# Patient Record
Sex: Male | Born: 1935 | Race: White | Hispanic: No | Marital: Married | Smoking: Former smoker
Health system: Southern US, Community
[De-identification: ages and names within clinical notes are randomized; demographics above are authoritative.]

## PROBLEM LIST (undated history)

## (undated) DIAGNOSIS — J189 Pneumonia, unspecified organism: Secondary | ICD-10-CM

## (undated) DIAGNOSIS — R609 Edema, unspecified: Secondary | ICD-10-CM

## (undated) DIAGNOSIS — IMO0002 Reserved for concepts with insufficient information to code with codable children: Secondary | ICD-10-CM

## (undated) DIAGNOSIS — I8390 Asymptomatic varicose veins of unspecified lower extremity: Secondary | ICD-10-CM

## (undated) DIAGNOSIS — I10 Essential (primary) hypertension: Secondary | ICD-10-CM

## (undated) DIAGNOSIS — I351 Nonrheumatic aortic (valve) insufficiency: Secondary | ICD-10-CM

## (undated) DIAGNOSIS — I350 Nonrheumatic aortic (valve) stenosis: Secondary | ICD-10-CM

## (undated) DIAGNOSIS — I6529 Occlusion and stenosis of unspecified carotid artery: Secondary | ICD-10-CM

## (undated) DIAGNOSIS — I77810 Thoracic aortic ectasia: Secondary | ICD-10-CM

## (undated) DIAGNOSIS — G459 Transient cerebral ischemic attack, unspecified: Secondary | ICD-10-CM

## (undated) DIAGNOSIS — R55 Syncope and collapse: Secondary | ICD-10-CM

## (undated) DIAGNOSIS — I495 Sick sinus syndrome: Secondary | ICD-10-CM

## (undated) DIAGNOSIS — R9430 Abnormal result of cardiovascular function study, unspecified: Secondary | ICD-10-CM

## (undated) HISTORY — DX: Sick sinus syndrome: I49.5

## (undated) HISTORY — DX: Asymptomatic varicose veins of unspecified lower extremity: I83.90

## (undated) HISTORY — DX: Thoracic aortic ectasia: I77.810

## (undated) HISTORY — DX: Nonrheumatic aortic (valve) stenosis: I35.0

## (undated) HISTORY — DX: Transient cerebral ischemic attack, unspecified: G45.9

## (undated) HISTORY — DX: Abnormal result of cardiovascular function study, unspecified: R94.30

## (undated) HISTORY — DX: Reserved for concepts with insufficient information to code with codable children: IMO0002

## (undated) HISTORY — DX: Pneumonia, unspecified organism: J18.9

## (undated) HISTORY — DX: Essential (primary) hypertension: I10

## (undated) HISTORY — DX: Syncope and collapse: R55

## (undated) HISTORY — DX: Edema, unspecified: R60.9

## (undated) HISTORY — DX: Nonrheumatic aortic (valve) insufficiency: I35.1

## (undated) HISTORY — DX: Occlusion and stenosis of unspecified carotid artery: I65.29

---

## 2004-07-25 ENCOUNTER — Ambulatory Visit: Payer: Self-pay | Admitting: Cardiology

## 2004-07-31 ENCOUNTER — Ambulatory Visit: Payer: Self-pay | Admitting: Cardiology

## 2004-07-31 ENCOUNTER — Ambulatory Visit (HOSPITAL_COMMUNITY): Admission: RE | Admit: 2004-07-31 | Discharge: 2004-07-31 | Payer: Self-pay | Admitting: Cardiology

## 2004-07-31 ENCOUNTER — Ambulatory Visit: Payer: Self-pay

## 2004-08-06 ENCOUNTER — Ambulatory Visit: Payer: Self-pay | Admitting: Cardiology

## 2004-11-08 ENCOUNTER — Ambulatory Visit: Payer: Self-pay | Admitting: Cardiology

## 2005-02-25 ENCOUNTER — Encounter: Admission: RE | Admit: 2005-02-25 | Discharge: 2005-02-25 | Payer: Self-pay | Admitting: *Deleted

## 2005-07-03 ENCOUNTER — Ambulatory Visit: Payer: Self-pay | Admitting: Cardiology

## 2006-02-27 ENCOUNTER — Encounter: Admission: RE | Admit: 2006-02-27 | Discharge: 2006-02-27 | Payer: Self-pay | Admitting: *Deleted

## 2006-06-11 ENCOUNTER — Ambulatory Visit: Payer: Self-pay | Admitting: Cardiology

## 2006-09-18 ENCOUNTER — Encounter: Admission: RE | Admit: 2006-09-18 | Discharge: 2006-09-18 | Payer: Self-pay | Admitting: *Deleted

## 2006-09-18 ENCOUNTER — Ambulatory Visit: Payer: Self-pay | Admitting: *Deleted

## 2007-03-05 ENCOUNTER — Ambulatory Visit: Payer: Self-pay | Admitting: *Deleted

## 2007-03-05 ENCOUNTER — Encounter: Admission: RE | Admit: 2007-03-05 | Discharge: 2007-03-05 | Payer: Self-pay | Admitting: *Deleted

## 2007-05-13 ENCOUNTER — Encounter: Payer: Self-pay | Admitting: Cardiology

## 2007-05-13 ENCOUNTER — Ambulatory Visit: Payer: Self-pay | Admitting: Cardiology

## 2007-05-13 ENCOUNTER — Ambulatory Visit: Payer: Self-pay

## 2007-05-22 ENCOUNTER — Ambulatory Visit: Payer: Self-pay | Admitting: Cardiology

## 2007-07-13 ENCOUNTER — Ambulatory Visit: Payer: Self-pay | Admitting: Cardiology

## 2008-01-27 ENCOUNTER — Ambulatory Visit: Payer: Self-pay | Admitting: Cardiology

## 2008-03-03 ENCOUNTER — Ambulatory Visit: Payer: Self-pay | Admitting: *Deleted

## 2008-07-05 ENCOUNTER — Ambulatory Visit: Payer: Self-pay | Admitting: Cardiology

## 2008-07-19 ENCOUNTER — Encounter: Payer: Self-pay | Admitting: Cardiology

## 2008-07-19 ENCOUNTER — Ambulatory Visit: Payer: Self-pay

## 2008-08-22 ENCOUNTER — Encounter: Payer: Self-pay | Admitting: Cardiology

## 2008-08-22 DIAGNOSIS — I10 Essential (primary) hypertension: Secondary | ICD-10-CM

## 2008-08-22 DIAGNOSIS — I714 Abdominal aortic aneurysm, without rupture, unspecified: Secondary | ICD-10-CM | POA: Insufficient documentation

## 2008-08-22 DIAGNOSIS — I495 Sick sinus syndrome: Secondary | ICD-10-CM

## 2008-08-22 HISTORY — DX: Sick sinus syndrome: I49.5

## 2008-08-22 HISTORY — DX: Essential (primary) hypertension: I10

## 2008-08-23 ENCOUNTER — Ambulatory Visit: Payer: Self-pay | Admitting: Cardiology

## 2009-03-24 ENCOUNTER — Encounter: Admission: RE | Admit: 2009-03-24 | Discharge: 2009-03-24 | Payer: Self-pay | Admitting: Vascular Surgery

## 2009-03-24 ENCOUNTER — Ambulatory Visit: Payer: Self-pay | Admitting: Vascular Surgery

## 2009-05-08 ENCOUNTER — Encounter: Payer: Self-pay | Admitting: Cardiology

## 2009-05-25 ENCOUNTER — Encounter: Admission: RE | Admit: 2009-05-25 | Discharge: 2009-05-25 | Payer: Self-pay | Admitting: Family Medicine

## 2009-07-20 ENCOUNTER — Ambulatory Visit: Payer: Self-pay | Admitting: Family Medicine

## 2009-07-23 ENCOUNTER — Encounter (INDEPENDENT_AMBULATORY_CARE_PROVIDER_SITE_OTHER): Payer: Self-pay | Admitting: Emergency Medicine

## 2009-07-23 ENCOUNTER — Emergency Department (HOSPITAL_COMMUNITY): Admission: EM | Admit: 2009-07-23 | Discharge: 2009-07-23 | Payer: Self-pay | Admitting: Emergency Medicine

## 2009-07-23 ENCOUNTER — Ambulatory Visit: Payer: Self-pay | Admitting: Vascular Surgery

## 2009-08-16 ENCOUNTER — Ambulatory Visit: Payer: Self-pay | Admitting: Family Medicine

## 2009-08-16 ENCOUNTER — Encounter: Payer: Self-pay | Admitting: Cardiology

## 2009-08-17 ENCOUNTER — Encounter: Payer: Self-pay | Admitting: Cardiology

## 2009-08-18 ENCOUNTER — Encounter: Payer: Self-pay | Admitting: Cardiology

## 2009-08-20 ENCOUNTER — Encounter: Payer: Self-pay | Admitting: Cardiology

## 2009-08-20 DIAGNOSIS — I6529 Occlusion and stenosis of unspecified carotid artery: Secondary | ICD-10-CM

## 2009-08-20 HISTORY — DX: Occlusion and stenosis of unspecified carotid artery: I65.29

## 2009-08-21 ENCOUNTER — Ambulatory Visit: Payer: Self-pay

## 2009-08-21 ENCOUNTER — Ambulatory Visit: Payer: Self-pay | Admitting: Cardiology

## 2009-08-21 DIAGNOSIS — R609 Edema, unspecified: Secondary | ICD-10-CM | POA: Insufficient documentation

## 2009-08-21 HISTORY — DX: Edema, unspecified: R60.9

## 2009-08-22 ENCOUNTER — Ambulatory Visit (HOSPITAL_COMMUNITY): Admission: RE | Admit: 2009-08-22 | Discharge: 2009-08-22 | Payer: Self-pay | Admitting: Cardiology

## 2009-08-22 ENCOUNTER — Ambulatory Visit: Payer: Self-pay

## 2009-08-22 ENCOUNTER — Ambulatory Visit: Payer: Self-pay | Admitting: Cardiology

## 2009-08-22 ENCOUNTER — Encounter: Payer: Self-pay | Admitting: Cardiology

## 2009-08-24 ENCOUNTER — Ambulatory Visit: Payer: Self-pay | Admitting: Cardiology

## 2009-09-12 ENCOUNTER — Encounter: Payer: Self-pay | Admitting: Cardiology

## 2009-10-27 ENCOUNTER — Ambulatory Visit: Payer: Self-pay | Admitting: Cardiology

## 2010-04-18 ENCOUNTER — Ambulatory Visit: Payer: Self-pay | Admitting: Cardiology

## 2010-06-18 ENCOUNTER — Encounter: Payer: Self-pay | Admitting: Cardiology

## 2010-06-19 ENCOUNTER — Encounter: Payer: Self-pay | Admitting: Cardiovascular Disease

## 2010-06-20 ENCOUNTER — Encounter: Payer: Self-pay | Admitting: Cardiology

## 2010-06-21 ENCOUNTER — Encounter: Payer: Self-pay | Admitting: Cardiovascular Disease

## 2010-06-21 ENCOUNTER — Encounter: Payer: Self-pay | Admitting: Cardiology

## 2010-06-21 ENCOUNTER — Ambulatory Visit: Admission: RE | Admit: 2010-06-21 | Discharge: 2010-06-21 | Payer: Self-pay | Source: Home / Self Care

## 2010-06-22 ENCOUNTER — Encounter: Payer: Self-pay | Admitting: Cardiology

## 2010-06-26 ENCOUNTER — Encounter: Payer: Self-pay | Admitting: Cardiology

## 2010-07-03 NOTE — Assessment & Plan Note (Signed)
Summary: yearly/sl  Medications Added FUROSEMIDE 40 MG TABS (FUROSEMIDE) Take one tablet by mouth daily. HYDROCHLOROTHIAZIDE 25 MG TABS (HYDROCHLOROTHIAZIDE) Take one tablet by mouth daily. BYSTOLIC 5 MG TABS (NEBIVOLOL HCL) once daily      Allergies Added: NKDA  Visit Type:  Follow-up Referring Provider:  Julieanne Manson M.D. Primary Provider:  Julieanne Manson M.D.  CC:  edema.  History of Present Illness: The patient is seen to followup history of aortic valve disease.  He also had a significant pulmonary illness approximately 2 months ago.  It seems that he had a severe coughing spell and then significant hemoptysis while at home.  It is my understanding that he was thought to have had pneumonia.  He was treated as an outpatient.  He now has persistent edema.  This has partially responded to diuretics but he still has significant edema.  It is not yet clear if he had some of the edema before the pulmonary illness.  He has not had any significant chest pain.  He has no more hemoptysis.  He does feel weak.  He is not having chest pain.  Current Medications (verified): 1)  Aspirin 81 Mg  Tabs (Aspirin) .... Once Daily 2)  Quinapril Hcl 40 Mg Tabs (Quinapril Hcl) .Marland Kitchen.. 1 Once Daily 3)  Amlodipine Besylate 5 Mg Tabs (Amlodipine Besylate) .... Take One Tablet By Mouth Daily 4)  Metoprolol Tartrate 25 Mg Tabs (Metoprolol Tartrate) .... Take 1/2 Tablet By Mouth Twice A Day 5)  Vitamin D .... Once Daily 6)  Furosemide 40 Mg Tabs (Furosemide) .... Take One Tablet By Mouth Daily. 7)  Hydrochlorothiazide 25 Mg Tabs (Hydrochlorothiazide) .... Take One Tablet By Mouth Daily. 8)  Bystolic 5 Mg Tabs (Nebivolol Hcl) .... 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 starting in January or February of 2011 Hemoptysis... significant outpatient episode... February, 2011..... related to pneumonia by report pneumonia... right middle lobe.... July 23, 2009  Review of Systems       Patient denies fever, chills, headache, sweats, rash, change in vision, change in hearing, chest pain, nausea or vomiting, urinary symptoms.  All other systems are reviewed and are negative.  Vital Signs:  Patient profile:   77 year old male Height:      70 inches Weight:      132 pounds BMI:     19.01 Pulse rate:   55 / minute BP sitting:   108 / 52  (left arm) Cuff size:   regular  Vitals Entered By: Hardin Negus, RMA (August 21, 2009 11:11 AM)  Physical Exam  General:  patient is stable . Head:  head is atraumatic.  He has a skin lesion on his Eyes:  no xanthelasma. Neck:  no jugular venous distention. Chest Wall:  no chest wall tenderness. Lungs:  lungs reveal some decreased breath sounds in his right lung. Heart:  cardiac exam reveals an alert S2.  There is a murmur of aortic insufficiency. Abdomen:  abdomen is soft. Msk:  patient has kyphosis. Extremities:  there is 2+ peripheral edema bilaterally in the lower extremities. Skin:  patient has a skin lesion on the top of his head. Psych:  patient is oriented to person time and place.  Affect is normal.   Impression & Recommendations:  Problem #  1:  * HEMOPTYSIS / PNEUMONIA We're waiting for a followup chest x-ray done recently.  It appears that he had pneumonia by history.  We need to be sure that there is not a primary cardiac etiology for his hemoptysis.  This seems unlikely as he improved with antibiotics.  Problem # 2:  EDEMA (ICD-782.3) The etiology of his edema is not clear.  We need to rule out heart failure.  His recent labs also need to be reviewed to see if he has marked decreased  albumin.  Also needs no venous abnormal thyroid functions.  Patient will have a 2-D echo to reassess his LV function and his aortic insufficiency.  This will be scheduled see him back for early followup.  Problem # 3:  AORTIC INSUFFICIENCY (ICD-424.1)  His updated medication list for this problem includes:    Quinapril Hcl 40 Mg Tabs (Quinapril hcl) .Marland Kitchen... 1 once daily    Metoprolol Tartrate 25 Mg Tabs (Metoprolol tartrate) .Marland Kitchen... Take 1/2 tablet by mouth twice a day    Furosemide 40 Mg Tabs (Furosemide) .Marland Kitchen... Take one tablet by mouth daily.    Hydrochlorothiazide 25 Mg Tabs (Hydrochlorothiazide) .Marland Kitchen... Take one tablet by mouth daily.    Bystolic 5 Mg Tabs (Nebivolol hcl) ..... Once daily catheterization left followup 2-D echo to reassess his aortic insufficiency.  Orders: Echocardiogram (Echo)  Problem # 4:  CAROTID ARTERY DISEASE (ICD-433.10)  His updated medication list for this problem includes:    Aspirin 81 Mg Tabs (Aspirin) ..... Once daily A later date we will look at followup carotid Dopplers.  Problem # 5:  ESSENTIAL HYPERTENSION, BENIGN (ICD-401.1)  His updated medication list for this problem includes:    Aspirin 81 Mg Tabs (Aspirin) ..... Once daily    Quinapril Hcl 40 Mg Tabs (Quinapril hcl) .Marland Kitchen... 1 once daily    Amlodipine Besylate 5 Mg Tabs (Amlodipine besylate) .Marland Kitchen... Take one tablet by mouth daily    Metoprolol Tartrate 25 Mg Tabs (Metoprolol tartrate) .Marland Kitchen... Take 1/2 tablet by mouth twice a day    Furosemide 40 Mg Tabs (Furosemide) .Marland Kitchen... Take one tablet by mouth daily.    Hydrochlorothiazide 25 Mg Tabs (Hydrochlorothiazide) .Marland Kitchen... Take one tablet by mouth daily.    Bystolic 5 Mg Tabs (Nebivolol hcl) ..... Once daily Blood pressure is controlled today.  No change in therapy.  Patient Instructions: 1)  Your physician has requested that you have an echocardiogram.  Echocardiography is a painless test that uses sound waves to create images of your heart. It provides your  doctor with information about the size and shape of your heart and how well your heart's chambers and valves are working.  This procedure takes approximately one hour. There are no restrictions for this procedure. 2)  Follow up with Dr Myrtis Ser on [facility] 3/24

## 2010-07-03 NOTE — Miscellaneous (Signed)
Summary: Orders Update  Clinical Lists Changes 

## 2010-07-03 NOTE — Assessment & Plan Note (Signed)
Summary: 9 weeks f/u sl  Medications Added VITAMIN D3 1000 UNIT CAPS (CHOLECALCIFEROL) Take 1 tablet by mouth once a day ALPRAZOLAM 0.5 MG TABS (ALPRAZOLAM) as needed      Allergies Added: NKDA  Visit Type:  9 weeks follow up Referring Provider:  Julieanne Manson M.D. Primary Provider:  Julieanne Manson M.D.  CC:  bradycardia.  History of Present Illness: The patient is seen for followup of blood pressure treatment, sinus bradycardia, episode of hemoptysis.  Is doing very well.  He has not had any return of his cough or hemoptysis.  He believed that this was related to an outpatient pneumonia.  Several days ago he bruised his left ankle area.  This is healing appropriately.  He did not have syncope.  Current Medications (verified): 1)  Aspirin 81 Mg  Tabs (Aspirin) .... Once Daily 2)  Quinapril Hcl 40 Mg Tabs (Quinapril Hcl) .Marland Kitchen.. 1 Once Daily 3)  Amlodipine Besylate 5 Mg Tabs (Amlodipine Besylate) .... Take One Tablet By Mouth Daily 4)  Vitamin D3 1000 Unit Caps (Cholecalciferol) .... Take 1 Tablet By Mouth Once A Day 5)  Furosemide 40 Mg Tabs (Furosemide) .Marland Kitchen.. 1 By Mouth M-W-F 6)  Hydrochlorothiazide 25 Mg Tabs (Hydrochlorothiazide) .... Take One Tablet By Mouth Daily. 7)  Bystolic 5 Mg Tabs (Nebivolol Hcl) .... Once Daily 8)  Alprazolam 0.5 Mg Tabs (Alprazolam) .... 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 starting in January or February of 2011.Marland KitchenMarland Kitchenprobably mild volume overload and venous insufficiency Hemoptysis... significant outpatient episode... February, 2011..... related to pneumonia by report pneumonia... right middle lobe....  July 23, 2009.. /  chest x-ray August 16, 2009... almost complete clearing  Review of Systems       Patient denies fever, chills, headache, sweats, rash, change in vision, change in hearing, chest pain, cough, nausea vomiting, urinary symptoms.  All other systems are reviewed and are negative.  Vital Signs:  Patient profile:   77 year old male Height:      70 inches Weight:      129 pounds BMI:     18.58 Pulse rate:   42 / minute Pulse rhythm:   regular Resp:     18 per minute BP sitting:   130 / 60  (left arm) Cuff size:   regular  Vitals Entered By: Vikki Ports (Oct 27, 2009 8:32 AM)  Physical Exam  General:  he looks quite good today. Eyes:  no xanthelasma. Neck:  no jugular venous tension. Lungs:  lungs are clear.  Respiratory effort is not labored. Heart:  cardiac exam reveals S1-S2.  There is a 2/6 murmur of aortic insufficiency. Abdomen:  abdomen is soft. Extremities:  he is wearing support hose.  No peripheral edema. Psych:  patient is oriented to person time and place.  Affect is normal.   Impression & Recommendations:  Problem # 1:  * HEMOPTYSIS / PNEUMONIA This tissue is completely resolved.  Problem # 2:  EDEMA (ICD-782.3) This issue is stable.  No change in therapy.  Recent labs done in April show creatinine of 1.04.  Potassium 4.6.  Problem # 3:  AORTIC INSUFFICIENCY (ICD-424.1) aortic insufficiency is mild/moderate.  As we follow.  Problem # 4:  SINUS BRADYCARDIA (ICD-427.81) EKG is done today and reviewed by me.  He  has sinus bradycardia.  This is an old problem.  He does not have symptoms.  No change in his meds at this time.  At some point we may have to completely stop any beta-blockade.followup in 6 months.  Other Orders: EKG w/ Interpretation (93000)  Patient Instructions: 1)  Your physician wants you to follow-up in:   6 months.  You will receive a reminder letter in the mail two months in advance. If you don't receive a letter, please call  our office to schedule the follow-up appointment.

## 2010-07-03 NOTE — Miscellaneous (Signed)
Summary: Orders Update  Clinical Lists Changes  Orders: Added new Test order of Carotid Duplex (Carotid Duplex) - Signed 

## 2010-07-03 NOTE — Assessment & Plan Note (Signed)
Summary: PER CHECK OUT/SF  Medications Added FUROSEMIDE 40 MG TABS (FUROSEMIDE) 1 by mouth m-w-f      Allergies Added: NKDA  Visit Type:  Follow-up Primary Provider:  Julieanne Manson M.D.  CC:  edema.  History of Present Illness: The patient is seen in followup evaluation of his edema. see my complete note of August 21, 2009.  We have received information from Dr.Gilbert.  Chest x-ray was done August 16, 2009.  It was compared with the film of July 20, 2009.  There was near complete clearing of the previously noted dense infiltrate in the right middle lobe.  There was no CHF.  The chest is hyperexpanded.  There were old vertebral compression deformities at T8.  There were old rib fractures of the seventh, eighth, ninth, and 10th ribs on the right. I also received blood results from August 18, 2009.  Hemoglobin is 12.5.  BUN is 18 and creatinine 0.98.  Potassium was 4.6.  Albumin was in the normal range.  TSH was normal also. An echo was ordered and I have carefully reviewed this study personally.  Ejection fraction is 60%.  There is mild aortic stenosis and moderate aortic regurgitation.  There is no significant change from the past.  Patient is a feeling better.  His edema is improving but still present.  It does decrease at nighttime.  He has a skin lesion on the anterior aspect of his right shin.  This has been present for a prolonged period of time.  Current Medications (verified): 1)  Aspirin 81 Mg  Tabs (Aspirin) .... Once Daily 2)  Quinapril Hcl 40 Mg Tabs (Quinapril Hcl) .Marland Kitchen.. 1 Once Daily 3)  Amlodipine Besylate 5 Mg Tabs (Amlodipine Besylate) .... Take One Tablet By Mouth Daily 4)  Metoprolol Tartrate 25 Mg Tabs (Metoprolol Tartrate) .... Take 1/2 Tablet By Mouth Twice A Day 5)  Vitamin D .... Once Daily 6)  Furosemide 40 Mg Tabs (Furosemide) .Marland Kitchen.. 1 By Mouth M-W-F 7)  Hydrochlorothiazide 25 Mg Tabs (Hydrochlorothiazide) .... Take One Tablet By Mouth Daily. 8)  Bystolic 5 Mg Tabs  (Nebivolol Hcl) .... 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 starting in January or February of 2011.Marland KitchenMarland Kitchenprobably mild volume overload and venous insufficiency Hemoptysis... significant outpatient episode... February, 2011..... related to pneumonia by report pneumonia... right middle lobe.... July 23, 2009.. /  chest x-ray August 16, 2009... almost complete clearing  Review of Systems       Patient denies fever, chills, headache, sweats, rash, change in vision, change in hearing, chest pain, cough, shortness of breath, nausea or vomiting, urinary symptoms.  All other systems are reviewed and are negative.  Vital Signs:  Patient profile:   77 year old male Height:      70 inches Weight:      131 pounds BMI:     18.86 Pulse rate:   48 / minute Resp:     16 per minute BP sitting:   119 / 54  (right arm)  Vitals Entered By: Marrion Coy, CNA (August 24, 2009 10:45 AM)  Physical Exam  General:  patient is stable in general. Head:  head is atraumatic. Eyes:  no xanthelasma. Neck:  no jugular venous distention.  Radiated murmur to the neck. Chest Wall:  no chest  wall tenderness. Lungs:  lungs are clear.  Respiratory effort is nonlabored. Heart:  cardiac exam reveals S1-S2.  There is a systolic murmur and a murmur of aortic insufficiency. Abdomen:  abdomen is soft. Msk:  patient has kyphosis of the spine. Extremities:  there is 1+ peripheral edema bilaterally.  It is slightly worse in the right leg.  There is a bandage on the anterior aspect of the right shin. Skin:  her no skin rashes. Psych:  patient is oriented to person time and place.   Affect is normal.   Impression & Recommendations:  Problem # 1:  * HEMOPTYSIS / PNEUMONIA The patient's pneumonia appears to be resolved.  Chest x-ray has been read as almost complete clearing.  We will look into a followup film several months.  It appears that the hemoptysis was most likely related completely to his pneumonia.  Problem # 2:  EDEMA (ICD-782.3) It seems that they're several components to his edema.  There is mild volume overload that is treated with diuretics and should be continued.  There is a component of venous insufficiency also.  I've encouraged him to go ahead and wear his support hose.  He understands to put them on the morning.  I do feel that this is appropriate on both legs including putting the support hose over the bandage on his right leg.  This may help with healing.  If he has further difficulties with this skin lesion on his right leg he'll need further evaluation  Problem # 3:  AORTIC INSUFFICIENCY (ICD-424.1)  His updated medication list for this problem includes:    Quinapril Hcl 40 Mg Tabs (Quinapril hcl) .Marland Kitchen... 1 once daily    Metoprolol Tartrate 25 Mg Tabs (Metoprolol tartrate) .Marland Kitchen... Take 1/2 tablet by mouth twice a day    Furosemide 40 Mg Tabs (Furosemide) .Marland Kitchen... 1 by mouth m-w-f    Hydrochlorothiazide 25 Mg Tabs (Hydrochlorothiazide) .Marland Kitchen... Take one tablet by mouth daily.    Bystolic 5 Mg Tabs (Nebivolol hcl) ..... Once daily The echo shows that the aortic insufficiency remains moderate.  There is very mild aortic stenosis.  No further workup at this time.  Problem # 4:  ESSENTIAL HYPERTENSION, BENIGN (ICD-401.1)  His updated medication list for this problem includes:    Aspirin 81 Mg Tabs (Aspirin) ..... Once daily    Quinapril Hcl 40 Mg Tabs (Quinapril hcl) .Marland Kitchen... 1 once daily    Amlodipine Besylate 5 Mg Tabs (Amlodipine besylate) .Marland Kitchen... Take one tablet by mouth daily    Metoprolol Tartrate 25 Mg Tabs (Metoprolol tartrate) .Marland Kitchen... Take 1/2 tablet by  mouth twice a day    Furosemide 40 Mg Tabs (Furosemide) .Marland Kitchen... 1 by mouth m-w-f    Hydrochlorothiazide 25 Mg Tabs (Hydrochlorothiazide) .Marland Kitchen... Take one tablet by mouth daily.    Bystolic 5 Mg Tabs (Nebivolol hcl) ..... Once daily Blood pressure is under good control.  No change in therapy.  I will see him back in approximately 8 weeks to recheck his overall status and review all of his problems again.  Patient Instructions: 1)  Follow up in 9 weeks

## 2010-07-03 NOTE — Assessment & Plan Note (Signed)
Summary: Benjamin Mueller   Visit Type:  6 months follow up Referring Provider:  Julieanne Manson M.D. Primary Provider:  Julieanne Manson M.D.  CC:  hypertension.  History of Present Illness: The patient is seen for followup of hypertension.  He also has mild aortic stenosis and mild aortic insufficiency.  I have reviewed his records back to 2009.  He had ultrasound of the abdomen and his aorta was 2.4 cm.  There was some ulcerated plaque in the posterior aspect of the abdominal aorta.  He was referred to vascular surgery.  There was no deep penetration and it was felt that this could be followed.  He has not had a followup ultrasound since then.   Is not having any chest pain.  There's been no shortness of breath.  Current Medications (verified): 1)  Aspirin 81 Mg  Tabs (Aspirin) .... Once Daily 2)  Quinapril Hcl 40 Mg Tabs (Quinapril Hcl) .Marland Kitchen.. 1 Once Daily 3)  Amlodipine Besylate 5 Mg Tabs (Amlodipine Besylate) .... Take One Tablet By Mouth Daily 4)  Vitamin D3 1000 Unit Caps (Cholecalciferol) .... Take 1 Tablet By Mouth Once A Day 5)  Furosemide 40 Mg Tabs (Furosemide) .Marland Kitchen.. 1 By Mouth M-W-F 6)  Hydrochlorothiazide 25 Mg Tabs (Hydrochlorothiazide) .... Take One Tablet By Mouth Daily. 7)  Bystolic 5 Mg Tabs (Nebivolol Hcl) .... Once Daily 8)  Alprazolam 0.5 Mg Tabs (Alprazolam) .... As Needed  Allergies (verified): No Known Drug Allergies  Past History:  Past Medical History: Carotid artery diseaase...minimal...doppler.Marland KitchenMarland Kitchen2/2010 EF  60%....echo   07/2008 AS...mild....echo.Marland Kitchen.07/2008 AI...moderate...echo.Marland Kitchen.07/2008 * ULCERATED PLAQUE IN AORTA  . 2009. abdominal aorta... vascular surgery felt this could be followed.Marland Kitchen aorta 2.4 cm at that time Thoracic aorta   mild dilatation ESSENTIAL HYPERTENSION, BENIGN (ICD-401.1) SINUS BRADYCARDIA (ICD-427.81) Edema... starting in January or February of 2011.Marland KitchenMarland Kitchenprobably mild volume overload and venous insufficiency Hemoptysis... significant outpatient  episode... February, 2011..... related to pneumonia by report pneumonia... right middle lobe.... July 23, 2009.. /  chest x-ray August 16, 2009... almost complete clearing  Review of Systems       Patient denies fever, chills, headache, sweats, rash, change in vision, change in hearing, nausea vomiting, urinary symptoms.  All of the systems are reviewed and are negative  Vital Signs:  Patient profile:   77 year old male Height:      70 inches Weight:      128.06 pounds BMI:     18.44 Pulse rate:   62 / minute BP sitting:   128 / 52  (left arm) Cuff size:   regular  Vitals Entered By: Caralee Ates CMA (April 18, 2010 9:28 AM)  Physical Exam  General:  patient is stable today. Head:  head is atraumatic.  He has some skin lesions on his head. Eyes:  no xanthelasma. Neck:  no jugular venous distention. Chest Wall:  no chest wall tenderness. Lungs:  lungs are clear.  Respiratory effort is nonlabored. Heart:  cardiac exam reveals S1-S2.  No clicks or significant murmurs. Abdomen:  abdomen is soft. Msk:  no musculoskeletal deformities. Extremities:  no peripheral edema. Skin:  no skin rashes. Psych:  patient is oriented to person time and place affect is normal.   Impression & Recommendations:  Problem # 1:  * THORACIC AORTA... MILD DILATATION This will be followed carefully over time by echo.  Problem # 2:  * HEMOPTYSIS / PNEUMONIA This problem has completely resolved.  There's been no recurrence.  Problem # 3:  EDEMA (ICD-782.3) Is no edema.  He is stable.  Problem # 4:  AORTIC STENOSIS (ICD-424.1)  His updated medication list for this problem includes:    Quinapril Hcl 40 Mg Tabs (Quinapril hcl) .Marland Kitchen... 1 once daily    Furosemide 40 Mg Tabs (Furosemide) .Marland Kitchen... 1 by mouth m-w-f    Hydrochlorothiazide 25 Mg Tabs (Hydrochlorothiazide) .Marland Kitchen... Take one tablet by mouth daily.    Bystolic 5 Mg Tabs (Nebivolol hcl) ..... Once daily His aortic valve disease can be followed by  echo over time.  No studies are needed at this time.  Problem # 5:  * ULCERATED PLAQUE IN AORTA I talked to the patient about follow up ultrasound.  He is and at this time.  I feel there is no urgency.  We will rediscuss it in 6 months.  Problem # 6:  ESSENTIAL HYPERTENSION, BENIGN (ICD-401.1)  His updated medication list for this problem includes:    Aspirin 81 Mg Tabs (Aspirin) ..... Once daily    Quinapril Hcl 40 Mg Tabs (Quinapril hcl) .Marland Kitchen... 1 once daily    Amlodipine Besylate 5 Mg Tabs (Amlodipine besylate) .Marland Kitchen... Take one tablet by mouth daily    Furosemide 40 Mg Tabs (Furosemide) .Marland Kitchen... 1 by mouth m-w-f    Hydrochlorothiazide 25 Mg Tabs (Hydrochlorothiazide) .Marland Kitchen... Take one tablet by mouth daily.    Bystolic 5 Mg Tabs (Nebivolol hcl) ..... Once daily Blood pressure is well-controlled.  No change in therapy.  Problem # 7:  SINUS BRADYCARDIA (ICD-427.81)  His updated medication list for this problem includes:    Aspirin 81 Mg Tabs (Aspirin) ..... Once daily    Quinapril Hcl 40 Mg Tabs (Quinapril hcl) .Marland Kitchen... 1 once daily    Amlodipine Besylate 5 Mg Tabs (Amlodipine besylate) .Marland Kitchen... Take one tablet by mouth daily    Bystolic 5 Mg Tabs (Nebivolol hcl) ..... Once daily Heart rate is stable.  No change in therapy.cardiology followup in 6 months.  Patient Instructions: 1)  Your physician wants you to follow-up in:  6 months.  You will receive a reminder letter in the mail two months in advance. If you don't receive a letter, please call our office to schedule the follow-up appointment.

## 2010-07-03 NOTE — Miscellaneous (Signed)
  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 (08/20/2009 20:43) Added new observation of PRIMARY MD: Julieanne Manson M.D. (08/20/2009 20:43)       Past History:  Past Medical History:  Carotid artery diseaase...minimal...doppler.Marland KitchenMarland Kitchen2/2010 EF  60%....echo   07/2008 AS...mild....echo.Marland Kitchen.07/2008 AI...moderate...echo.Marland Kitchen.07/2008 * ULCERATED PLAQUE IN AORTA ABDOMINAL AORTIC ANEURYSM (ICD-441.4) ESSENTIAL HYPERTENSION, BENIGN (ICD-401.1) SINUS BRADYCARDIA (ICD-427.81)

## 2010-07-05 NOTE — Miscellaneous (Signed)
  Clinical Lists Changes  Observations: Added new observation of PAST MED HX: Carotid artery diseaase...minimal...doppler.Marland KitchenMarland Kitchen2/2010 EF  60%....echo   07/2008  /  EF 60%... echo... March, 2011 AS...mild....echo.Marland Kitchen.07/2008  /  mild... echo.. March, 2011 AI...moderate...echo.Marland Kitchen.07/2008  /  moderate... echo... march 2011 * ULCERATED PLAQUE IN [place]ORTA  . 2009. abdominal aorta... vascular surgery felt this could be followed.Marland Kitchen aorta 2.4 cm at that time Thoracic aorta   mild dilatation ESSENTIAL HYPERTENSION, BENIGN (ICD-401.1) SINUS BRADYCARDIA (ICD-427.81) Edema... starting in January or February of 2011.Marland KitchenMarland Kitchenprobably mild volume overload and venous insufficiency Hemoptysis... significant outpatient episode... February, 2011..... related to pneumonia by report pneumonia... right middle lobe.... July 23, 2009.. /  chest x-ray August 16, 2009... almost complete clearing  (06/20/2010 9:36) Added new observation of REFERRING MD: Julieanne Manson M.D. (06/20/2010 9:36) Added new observation of PRIMARY MD: Julieanne Manson M.D. (06/20/2010 9:36)       Past History:  Past Medical History: Carotid artery diseaase...minimal...doppler.Marland KitchenMarland Kitchen2/2010 EF  60%....echo   07/2008  /  EF 60%... echo... March, 2011 AS...mild....echo.Marland Kitchen.07/2008  /  mild... echo.. March, 2011 AI...moderate...echo.Marland Kitchen.07/2008  /  moderate... echo... march 2011 * ULCERATED PLAQUE IN [place]  . 2009. abdominal aorta... vascular surgery felt this could be followed.Marland Kitchen aorta 2.4 cm at that time Thoracic aorta   mild dilatation ESSENTIAL HYPERTENSION, BENIGN (ICD-401.1) SINUS BRADYCARDIA (ICD-427.81) Edema... starting in January or February of 2011.Marland KitchenMarland Kitchenprobably mild volume overload and venous insufficiency Hemoptysis... significant outpatient episode... February, 2011..... related to pneumonia by report pneumonia... right middle lobe.... July 23, 2009.. /  chest x-ray August 16, 2009... almost complete clearing

## 2010-07-05 NOTE — Assessment & Plan Note (Signed)
Summary: [facility]   Referring Provider:  Julieanne Manson M.D. Primary Provider:  Julieanne Manson M.D.   History of Present Illness: I spoke with Dr. Sullivan Lone 06/20/2010. Pt. had back/ abdominal pain. I arranged ultrasound of aorta. It is unchanged. I feel pain is not cardiovascular. I called DR. Sullivan Lone back to let him know. I called pt.'s wife also. No further cardiac w/u  now.  Allergies: No Known Drug Allergies

## 2010-07-05 NOTE — Miscellaneous (Signed)
Summary: Orders Update  Clinical Lists Changes  Orders: Added new Test order of Abdominal Aorta Duplex (Abd Aorta Duplex) - Signed 

## 2010-07-05 NOTE — Miscellaneous (Signed)
  Clinical Lists Changes  Observations: Added new observation of PAST MED HX: Carotid artery diseaase...minimal...doppler.Marland KitchenMarland Kitchen2/2010 EF  60%....echo   07/2008  /  EF 60%... echo... March, 2011 AS...mild....echo.Marland Kitchen.07/2008  /  mild... echo.. March, 2011 AI...moderate...echo.Marland Kitchen.07/2008  /  moderate... echo... march 2011 * ULCERATED PLAQUE IN [place]  . 2009. abdominal aorta... vascular surgery felt this could be followed.Marland Kitchen aorta 2.4 cm at that time /  followed by Dr. Madilyn Fireman over time  /   duplex of the abdominal aorta, our office, June 21, 2010, normal caliber aorta... no dissection... irregular plaque in the mid and distal aorta with ulcerative appearance similar to prior study, severe stenosis of IMA Thoracic aorta   mild dilatation ESSENTIAL HYPERTENSION, BENIGN (ICD-401.1) SINUS BRADYCARDIA (ICD-427.81) Edema... starting in January or February of 2011.Marland KitchenMarland Kitchenprobably mild volume overload and venous insufficiency Hemoptysis... significant outpatient episode... February, 2011..... related to pneumonia by report pneumonia... right middle lobe.... July 23, 2009.. /  chest x-ray August 16, 2009... almost complete clearing  (06/26/2010 10:09) Added new observation of REFERRING MD: Julieanne Manson M.D. (06/26/2010 10:09) Added new observation of PRIMARY MD: Julieanne Manson M.D. (06/26/2010 10:09)       Past History:  Past Medical History: Carotid artery diseaase...minimal...doppler.Marland KitchenMarland Kitchen2/2010 EF  60%....echo   07/2008  /  EF 60%... echo... March, 2011 AS...mild....echo.Marland Kitchen.07/2008  /  mild... echo.. March, 2011 AI...moderate...echo.Marland Kitchen.07/2008  /  moderate... echo... march 2011 * ULCERATED PLAQUE IN [place]  . 2009. abdominal aorta... vascular surgery felt this could be followed.Marland Kitchen aorta 2.4 cm at that time /  followed by Dr. Madilyn Fireman over time  /   duplex of the abdominal aorta, our office, June 21, 2010, normal caliber aorta... no dissection... irregular plaque in the mid and distal aorta with ulcerative  appearance similar to prior study, severe stenosis of IMA Thoracic aorta   mild dilatation ESSENTIAL HYPERTENSION, BENIGN (ICD-401.1) SINUS BRADYCARDIA (ICD-427.81) Edema... starting in January or February of 2011.Marland KitchenMarland Kitchenprobably mild volume overload and venous insufficiency Hemoptysis... significant outpatient episode... February, 2011..... related to pneumonia by report pneumonia... right middle lobe.... July 23, 2009.. /  chest x-ray August 16, 2009... almost complete clearing

## 2010-07-31 NOTE — Letter (Signed)
Summary: [facility]   Imported By: Marylou Mccoy 07/26/2010 13:31:47  _____________________________________________________________________  External Attachment:    Type:   Image     Comment:   External Document

## 2010-08-24 LAB — CBC
MCHC: 34 g/dL (ref 30.0–36.0)
MCV: 100.1 fL — ABNORMAL HIGH (ref 78.0–100.0)
Platelets: 325 10*3/uL (ref 150–400)
RDW: 13.1 % (ref 11.5–15.5)

## 2010-08-24 LAB — URINALYSIS, ROUTINE W REFLEX MICROSCOPIC
Bilirubin Urine: NEGATIVE
Ketones, ur: NEGATIVE mg/dL
Nitrite: NEGATIVE
Protein, ur: NEGATIVE mg/dL
Specific Gravity, Urine: 1.012 (ref 1.005–1.030)
Urobilinogen, UA: 0.2 mg/dL (ref 0.0–1.0)

## 2010-08-24 LAB — POCT I-STAT, CHEM 8
BUN: 15 mg/dL (ref 6–23)
Calcium, Ion: 1.22 mmol/L (ref 1.12–1.32)
HCT: 35 % — ABNORMAL LOW (ref 39.0–52.0)
Hemoglobin: 11.9 g/dL — ABNORMAL LOW (ref 13.0–17.0)
TCO2: 31 mmol/L (ref 0–100)

## 2010-08-24 LAB — D-DIMER, QUANTITATIVE: D-Dimer, Quant: 1.2 ug/mL-FEU — ABNORMAL HIGH (ref 0.00–0.48)

## 2010-08-24 LAB — DIFFERENTIAL
Basophils Relative: 0 % (ref 0–1)
Eosinophils Absolute: 0.1 10*3/uL (ref 0.0–0.7)
Neutrophils Relative %: 75 % (ref 43–77)

## 2010-08-24 LAB — POTASSIUM: Potassium: 5.8 mEq/L — ABNORMAL HIGH (ref 3.5–5.1)

## 2010-08-28 ENCOUNTER — Other Ambulatory Visit: Payer: Self-pay | Admitting: Cardiology

## 2010-08-30 NOTE — Telephone Encounter (Signed)
[address] °

## 2010-09-11 ENCOUNTER — Encounter: Payer: Self-pay | Admitting: Physician Assistant

## 2010-09-11 ENCOUNTER — Ambulatory Visit (INDEPENDENT_AMBULATORY_CARE_PROVIDER_SITE_OTHER): Payer: Self-pay | Admitting: Physician Assistant

## 2010-09-11 VITALS — BP 185/68 | HR 58 | Ht 68.0 in | Wt 132.0 lb

## 2010-09-11 DIAGNOSIS — I6529 Occlusion and stenosis of unspecified carotid artery: Secondary | ICD-10-CM

## 2010-09-11 DIAGNOSIS — R42 Dizziness and giddiness: Secondary | ICD-10-CM | POA: Insufficient documentation

## 2010-09-11 DIAGNOSIS — I10 Essential (primary) hypertension: Secondary | ICD-10-CM

## 2010-09-11 DIAGNOSIS — I495 Sick sinus syndrome: Secondary | ICD-10-CM

## 2010-09-11 DIAGNOSIS — I359 Nonrheumatic aortic valve disorder, unspecified: Secondary | ICD-10-CM

## 2010-09-11 NOTE — Assessment & Plan Note (Signed)
Bilat. 0-39% stenosis in 2010.  Repeat dopplers.   Continue ASA.

## 2010-09-11 NOTE — Progress Notes (Signed)
History of Present Illness: Primary Cardiologist:  Dr. Zackery Barefoot  [facility] is a 77 y.o. male with a h/o HTN and abdominal aortic plaque.  He was added onto my schedule today with symptoms concerning for a TIA.  His symptoms occurred 4 days ago.  He changed his head position and became dizzy.  He felt as though he was going to pass out.  He denies frank syncope.  He denies spinning sensation.  He denies associated nausea, chest pain or shortness of breath.  He denies any exertional chest pain or shortness of breath.  He denies orthopnea or PND.  He has chronic pedal edema.  He recently had a venous procedure performed.  His symptoms of dizziness lasted approximately 5 minutes.  He did not feel well the next day and actually decided not to go to church, which is unusual for him.  He has had some word finding issues for the last several months per his wife.  She did not notice any significant change that day.  The patient denies any facial droop, unilateral weakness.  Over the last 2 days, he has felt back to baseline.  Past Medical History  Diagnosis Date  . CAROTID ARTERY DISEASE 08/20/2009    doppler 2/10:  0-39% bilat ICA   . Essential hypertension, benign 08/22/2008  . SINUS BRADYCARDIA 08/22/2008  . Edema 08/21/2009    prob. diastolic dysfxn; Echo 3/11: EF 60%, mild AS, mod AI, mild RAE  . Aortic stenosis   . Aortic insufficiency   . Atherosclerosis     aorta; duplex 1/12: stable, no dissection, mid to distal irregular plaque with ulcerative appearance similar to prior study; severe stenosis IMA    Current Outpatient Prescriptions  Medication Sig Dispense Refill  . ALPRAZolam (XANAX) 0.5 MG tablet Take 0.5 mg by mouth as needed.        Marland Kitchen amLODipine (NORVASC) 5 MG tablet TAKE ONE TABLET BY MOUTH DAILY  90 tablet  2  . aspirin 81 MG tablet Take 81 mg by mouth daily.        . Cholecalciferol (VITAMIN D3) 1000 UNITS tablet Take 1,000 Units by mouth daily.        . furosemide (LASIX) 40  MG tablet 1 tab M, W, F       . hydrochlorothiazide 25 MG tablet Take 25 mg by mouth daily.        . nebivolol (BYSTOLIC) 5 MG tablet Take 5 mg by mouth daily.        . quinapril (ACCUPRIL) 40 MG tablet Take 40 mg by mouth at bedtime.          No Known Allergies  History  Substance Use Topics  . Smoking status: Former Smoker    Quit date: 06/03/1970  . Smokeless tobacco: Not on file  . Alcohol Use: No    ROS:  Please see history of present illness.  He denies fevers, cough, melena.  He has some chronic dysuria.  He states that this is unchanged for the last several years.  All other systems reviewed and negative.  Vital Signs: BP 110/54  Pulse 51  Ht 5\' 8"  (1.727 m)  Wt 132 lb (59.875 kg)  BMI 20.07 kg/m2  PHYSICAL EXAM: Well nourished, well developed, Elderly male in no acute distress HEENT: normal Neck: no JVD Vascular: Bilateral carotid bruits versus radiating murmur Cardiac:  normal S1, S2; RRR; 2/6 systolic murmur heard best along the left sternal border as well as a 2/6  diastolic murmur Lungs:  clear to auscultation bilaterally, no wheezing, rhonchi or rales Abd: soft, nontender, no hepatomegaly Ext: Trace to 1+ bilateral edema Skin: warm and dry Neuro:  He displays some difficulty in finding words at times.  His speech is appropriate.  He does not have any obvious unilateral deficits.  Strength is equal in bilateral upper and lower extremities.  He has some shuffling gait. Psych: Normal affect  EKG:  Sinus bradycardia, heart rate 51, PR interval 264 ms (first degree AV block); LVH; no significant change since previous tracing  ASSESSMENT AND PLAN:

## 2010-09-11 NOTE — Assessment & Plan Note (Signed)
Etiology unclear.  It is not entirely certain that he has had a transient ischemic attack.  I spoke with Dr. Myrtis Ser via telephone.  From a cardiovascular standpoint, he may have had a bradycardic episode leading to near syncope.  We will obtain a 21 day event monitor.  If this does demonstrate any significant bradycardia, his beta blocker will need to be discontinued.  He is overdue for carotid Dopplers.  These will be obtained.  His aortic stenosis was mild and his aortic insufficiency was moderate at last check greater than 12 months ago.  His 2-D echocardiogram will be repeated.  We will also refer him to neurology for further evaluation.  For now, he will remain on aspirin.  I will have him follow up with Dr. Myrtis Ser in 2 weeks.

## 2010-09-11 NOTE — Assessment & Plan Note (Signed)
Mild AS and moderate AI in 2011.  Repeat Echo given recent h/o near syncope.

## 2010-09-11 NOTE — Patient Instructions (Addendum)
Your physician recommends that you schedule a follow-up appointment in: 2 weeks with Dr. Myrtis Ser as per Tereso Newcomer, PA-C.  Your physician has requested that you have a carotid duplex 433.10, 780.4. This test is an ultrasound of the carotid arteries in your neck. It looks at blood flow through these arteries that supply the brain with blood. Allow one hour for this exam. There are no restrictions or special instructions.  Your physician has requested that you have an echocardiogram 424.1, 780.4. Echocardiography is a painless test that uses sound waves to create images of your heart. It provides your doctor with information about the size and shape of your heart and how well your heart's chambers and valves are working. This procedure takes approximately one hour. There are no restrictions for this procedure.   Your physician has recommended that you wear an event monitor 780.4, 424.1. Event monitors are medical devices that record the heart's electrical activity. Doctors most often [place] these monitors to diagnose arrhythmias. Arrhythmias are problems with the speed or rhythm of the heartbeat. The monitor is a small, portable device. You can wear one while you do your normal daily activities. This is usually used to diagnose what is causing palpitations/syncope (passing out).  You have been referred to NEUROLOGY

## 2010-09-11 NOTE — Assessment & Plan Note (Signed)
Asymptomatic in the past.  Obtain 21 day event monitor.  If significant bradycardia or pauses, discontinue beta blocker.

## 2010-09-11 NOTE — Assessment & Plan Note (Signed)
Blood pressure somewhat elevated when rechecked.  He notes his pressures at home are typically 120s systolic.  I advised him to keep checking his pressures and to notify us if they remain elevated.  His amlodipine can be increased in this case.

## 2010-09-12 ENCOUNTER — Telehealth: Payer: Self-pay | Admitting: Cardiology

## 2010-09-12 NOTE — Telephone Encounter (Signed)
Spoke w/pt's wife she was confused about sch appt for test, advised pcc's will call her later to sch his test

## 2010-09-19 ENCOUNTER — Ambulatory Visit: Payer: Self-pay | Admitting: Physician Assistant

## 2010-09-21 ENCOUNTER — Encounter (INDEPENDENT_AMBULATORY_CARE_PROVIDER_SITE_OTHER): Payer: Medicare Other | Admitting: Cardiology

## 2010-09-21 ENCOUNTER — Ambulatory Visit (HOSPITAL_COMMUNITY): Payer: Medicare Other | Attending: Cardiology | Admitting: Radiology

## 2010-09-21 ENCOUNTER — Encounter (INDEPENDENT_AMBULATORY_CARE_PROVIDER_SITE_OTHER): Payer: Medicare Other

## 2010-09-21 DIAGNOSIS — R42 Dizziness and giddiness: Secondary | ICD-10-CM

## 2010-09-21 DIAGNOSIS — I6529 Occlusion and stenosis of unspecified carotid artery: Secondary | ICD-10-CM

## 2010-09-21 DIAGNOSIS — I359 Nonrheumatic aortic valve disorder, unspecified: Secondary | ICD-10-CM

## 2010-09-24 ENCOUNTER — Encounter: Payer: Self-pay | Admitting: Cardiology

## 2010-09-25 ENCOUNTER — Ambulatory Visit: Payer: Self-pay | Admitting: Physician Assistant

## 2010-09-27 ENCOUNTER — Encounter: Payer: Self-pay | Admitting: Cardiology

## 2010-09-27 DIAGNOSIS — I350 Nonrheumatic aortic (valve) stenosis: Secondary | ICD-10-CM | POA: Insufficient documentation

## 2010-09-27 DIAGNOSIS — I351 Nonrheumatic aortic (valve) insufficiency: Secondary | ICD-10-CM | POA: Insufficient documentation

## 2010-09-27 DIAGNOSIS — J189 Pneumonia, unspecified organism: Secondary | ICD-10-CM | POA: Insufficient documentation

## 2010-09-27 DIAGNOSIS — I7090 Unspecified atherosclerosis: Secondary | ICD-10-CM | POA: Insufficient documentation

## 2010-09-27 DIAGNOSIS — I77810 Thoracic aortic ectasia: Secondary | ICD-10-CM | POA: Insufficient documentation

## 2010-09-27 DIAGNOSIS — R042 Hemoptysis: Secondary | ICD-10-CM | POA: Insufficient documentation

## 2010-09-27 DIAGNOSIS — R9430 Abnormal result of cardiovascular function study, unspecified: Secondary | ICD-10-CM | POA: Insufficient documentation

## 2010-09-28 ENCOUNTER — Encounter: Payer: Self-pay | Admitting: Cardiology

## 2010-09-28 ENCOUNTER — Ambulatory Visit (INDEPENDENT_AMBULATORY_CARE_PROVIDER_SITE_OTHER): Payer: Medicare Other | Admitting: Cardiology

## 2010-09-28 DIAGNOSIS — R609 Edema, unspecified: Secondary | ICD-10-CM

## 2010-09-28 DIAGNOSIS — I351 Nonrheumatic aortic (valve) insufficiency: Secondary | ICD-10-CM

## 2010-09-28 DIAGNOSIS — R55 Syncope and collapse: Secondary | ICD-10-CM

## 2010-09-28 DIAGNOSIS — I359 Nonrheumatic aortic valve disorder, unspecified: Secondary | ICD-10-CM

## 2010-09-28 DIAGNOSIS — I495 Sick sinus syndrome: Secondary | ICD-10-CM

## 2010-09-28 DIAGNOSIS — I350 Nonrheumatic aortic (valve) stenosis: Secondary | ICD-10-CM

## 2010-09-28 NOTE — Assessment & Plan Note (Signed)
At this point the patient has had no recurrent spells.  We have arranged for him to have neurology consultation.  When neurology called the patient and his wife decided to delay the appointment.  At this point he's had no further spells and they would like to not proceed with any further neurology workup.  I am comfortable with this decision at this time.  Echo shows no change.  Carotid Dopplers revealed no marked abnormalities.  We do not have any significant monitoring data his rhythm appears to be stable.  He does have some beta-blockade with one of his medications.  However I've chosen not to change his medicines.  No further workup at this time.  As outlined in the history of present illness I have spoken with the patient's stepson concerning workup originally.

## 2010-09-28 NOTE — Patient Instructions (Addendum)
Your physician recommends that you schedule a follow-up appointment in: 3 months with Dr. Katz  

## 2010-09-28 NOTE — Assessment & Plan Note (Signed)
There is only mild aortic stenosis.  This is not causing the current problem.

## 2010-09-28 NOTE — Assessment & Plan Note (Signed)
He has not had return of any edema.  He appears to be stable.  No further workup.

## 2010-09-28 NOTE — Progress Notes (Signed)
HPI The patient is seen today for followup for a presyncopal episode.  I received a phone call from the patient's stepson., Dr. Julieanne Manson.  I arranged for the patient to be seen by Mr.Weaver.  This completes noted in the chart dated September 11, 2010.  The patient next he became dizzy with change of motion.  He then stabilized.  From the cardiac viewpoint he appeared to be stable.  Two-dimensional echo was done to followup LV function, and aortic valve disease.  His LV remained stable.  His valvular disease is stable.  Carotid Doppler followup was done and it showed only minimal disease.  We tried to have him wear an event recorder.  It appears that there was an attempt to put it on but he could not tolerate wearing it.  Therefore there is no significant data  .No Known Allergies  Current Outpatient Prescriptions  Medication Sig Dispense Refill  . ALPRAZolam (XANAX) 0.5 MG tablet Take 0.5 mg by mouth as needed.        Marland Kitchen amLODipine (NORVASC) 5 MG tablet TAKE ONE TABLET BY MOUTH DAILY  90 tablet  2  . aspirin 81 MG tablet Take 81 mg by mouth daily.        . Cholecalciferol (VITAMIN D3) 1000 UNITS tablet Take 1,000 Units by mouth daily.        . furosemide (LASIX) 40 MG tablet 20 mg. 1 tab M, W, F      . hydrochlorothiazide 25 MG tablet Take 25 mg by mouth daily.        . nebivolol (BYSTOLIC) 5 MG tablet Take 5 mg by mouth daily.        . quinapril (ACCUPRIL) 40 MG tablet Take 40 mg by mouth at bedtime.          History   Social History  . Marital Status: Married    Spouse Name: N/A    Number of Children: N/A  . Years of Education: N/A   Occupational History  . Not on file.   Social History Main Topics  . Smoking status: Former Smoker    Quit date: 06/03/1970  . Smokeless tobacco: Not on file  . Alcohol Use: No  . Drug Use: No  . Sexually Active: Not on file   Other Topics Concern  . Not on file   Social History Narrative  . No narrative on file    No family history on  file.  Past Medical History  Diagnosis Date  . CAROTID ARTERY DISEASE 08/20/2009    doppler 2/10:  0-39% bilat ICA  / Doppler, September 21, 2010, stable, 0-39% bilateral  . Essential hypertension, benign 08/22/2008  . SINUS BRADYCARDIA 08/22/2008  . Edema 08/21/2009    Started January, 2011, probable mild volume overload and venous insufficiency  . Aortic stenosis     Mild, echo, March, 2011 / mild, echo, April, 2012  . Aortic insufficiency     Moderate, echo, March, 2011 / Moderate, echo, April, 2012  . Atherosclerosis     aorta; duplex 1/12: stable, no dissection, mid to distal irregular plaque with ulcerative appearance similar to prior study; severe stenosis IMA  . Aortic root dilation     Mild dilatation thoracic aorta  . Hemoptysis     Significant outpatient episode February 2 011, related to pneumonia by her  . Ejection fraction     60% echo March, 2011 / EF 60-65%,, September 21, 2010, echo  . Pneumonia     Right  middle lobe, February, 2011 resolved  . Pre-syncope     March, 2012, echo-no change, carotid Doppler-minimal disease no change, event recorder-patient could not tolerate wearing.  No significant data obtained., eventually decided not to proceed with neurology consult    No past surgical history on file.  ROS  Patient denies fever, chills, headache, sweats, rash, change in vision, change in hearing, chest pain, cough, nausea vomiting, urinary symptoms.  All other systems are reviewed and are negative.  PHYSICAL EXAM Patient is stable today.  He is here with his wife.  He is oriented to person time and place.  Affect is normal.  Head is atraumatic.  He has his hearing aid in place.  There is no xanthelasma.  Lungs are clear.  Respiratory effort is nonlabored.  Cardiac reveals S1-S2.  There is a systolic murmur from his aortic valve.  His aortic sufficiency is soft.  Abdomen is soft.  There is no peripheral edema.  He does have some kyphosis of the spine.  There are no skin  rashes. Filed Vitals:   09/28/10 0939  BP: 159/61  Pulse: 61  Height: 5\' 8"  (1.727 m)  Weight: 136 lb (61.689 kg)    EKG No EKG is done today.  ASSESSMENT & PLAN

## 2010-09-28 NOTE — Assessment & Plan Note (Signed)
No change in his therapy at this time.  I will see him for early followup in approximately 3 months to be sure that he stable.

## 2010-09-28 NOTE — Assessment & Plan Note (Signed)
There is moderate aortic insufficiency but this is unchanged.  No further workup.

## 2010-10-16 NOTE — Assessment & Plan Note (Signed)
[facility]                            CARDIOLOGY OFFICE NOTE   Benjamin Mueller, Benjamin Mueller                     MRN:          [id number]  DATE:05/22/2007                            DOB:          07-Jun-1926    [name] returns today, and he is actually feeling very well.  I had  seen him in the office on May 13, 2007.  His pressure had jumped  up, and exact etiology was not clear.  After looking at him very  carefully, I gave him some beta blockers in the office and then added to  his medicine regimen extended release metoprolol 25 mg daily and  amlodipine 5 mg daily.  He has done well since then.  His blood pressure  is under better control.  The slight numbness in his left arm is gone.  We did a followup 2 D echocardiogram.  Study reveals that his ejection  fraction is 55-65%.  There is some calcification of the aortic valve,  and he has mild to moderate aortic regurgitation.  He has had aortic  regurgitation in the past.  There is very minimal aortic root  dilatation.  There is mild annular calcification.  When looking at the  ascending aorta, there is a linear density that most likely represents  an artifact.  I reviewed this with Dr. Jens Som.  I believe this is an  artifact.  At this time, I feel that further studies are not necessary,  but this area will be kept in mine.   Overall, he is doing well, and he is here with his wife today.   PAST MEDICAL HISTORY:   ALLERGIES:  No known drug allergies.   MEDICATIONS:  1. Aspirin 81.  2. Clinopril 40.  3. Metoprolol extended release 25.  4. Amlodipine 5 mg.   OTHER MEDICAL PROBLEMS:  See the list below.   REVIEW OF SYSTEMS:  He is really feeling fine now and doing well.   PHYSICAL EXAM:  Blood pressure is 124/60.  The pulse is 54.  The patient is oriented to person, time, and place.  Affect is normal.  HEENT:  No xanthelasma.  He has normal extraocular motion.  There are no carotid  bruits.  There is no jugular venous distention.  LUNGS:  Clear.  Respiratory effort is nonlabored.  CARDIAC:  An S1 with an S2.  There are no clicks.  There is a soft  systolic murmur, and there is soft murmur of AI.  ABDOMEN:  Soft.  He has no peripheral edema.   No other labs are done today.   PROBLEMS:  1. 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  2. Decreased pulses in his feet historically but normal Dopplers.  3. Hypertension.  With the adjustments in his medicines, his blood      pressure is under much better control, and we will continue these      medicines for now.  4. History of mild increase in his thoracic aorta, and this is stable      by echocardiogram.  5. History of some stenosis of the right subclavian/axillary artery  historically, and this will be followed.  6. Moderate to large herniated disk at T11 and 12 that is stable.  7. History of an ulcerated plaque along the posterior aorta but no      deep penetration.  No further workup was recommended when he saw      Dr. Jake Samples.  8. History of mild aortic insufficiency.  This may be slightly worse      than a year ago, but it is not marked, and it will be followed.  9. Slight discomfort in his left arm.  This has stabilized, and he is      not having any recurrent problems.  I believe he is stable.  I will      not change his medications further.  I will see him back in 6-8      weeks.  It will be fine for him to travel to [place] in the      meantime.     Luis Abed, MD, [facility]  Electronically Signed    JDK/MedQ  DD: 05/22/2007  DT: 05/23/2007  Job #: [id number]   cc:   Julieanne Manson

## 2010-10-16 NOTE — Assessment & Plan Note (Signed)
[facility]                            CARDIOLOGY OFFICE NOTE   SADLER, TESCHNER                     MRN:          [id number]  DATE:07/13/2007                            DOB:          04-02-1927    HISTORY OF PRESENT ILLNESS:  Mr. Sheppard Luckenbach (Dr. Gerlene Burdock Gilbert's stepfather) is doing well.  I  had seen him on May 22, 2007.  His blood pressure intermittently  jumps up, and we had seen him and made some adjustments.  His 2-D echo  was stable.  Ejection fraction was 55-65%.  There was minimal aortic  root dilatation.  I carefully looked at his ascending aorta.  I believe  that there was a linear artifact.  He is stable.  He has traveled to  [place], and he has done well since then.   PAST MEDICAL HISTORY:   ALLERGIES:  NO KNOWN DRUG ALLERGIES.   MEDICATIONS:  1. Aspirin 81.  2. Quinapril 40.  3. Metoprolol 25.  4. Amlodipine 5.   OTHER MEDICAL PROBLEMS:  See the list on my note of May 22, 2007.   REVIEW OF SYSTEMS:  He feels fine.  He is here with his wife.   PHYSICAL EXAMINATION:  VITAL SIGNS:  Weight is 138 pounds.  Blood  pressure is 133/55 with a pulse of 50.  GENERAL:  The patient is oriented to person, time and place.  Affect is  normal.  HEENT:  Reveals no xanthelasma.  He has normal extraocular motion.  There are no carotid bruits.  There is no jugular venous distention.  LUNGS:  Clear.  Respiratory effort is not labored.  CARDIAC:  Reveals S1-S2.  There are no clicks or significant murmurs.  ABDOMEN:  Soft.  No peripheral edema.   ASSESSMENT:  He is stable.  Problems are listed on my note of May 22, 2007.  1. Hypertension.  He is stable now.  2. Mild increase in the size of his thoracic aorta and this can be      followed over time.  3. History of ulcerated plaque in the posterior aorta with no deep      penetration followed by Dr. Madilyn Fireman.   PLAN:  I will see him back in 6 months.  We will decide on the  timing of any  further studies then.     Luis Abed, MD, [facility]  Electronically Signed    JDK/MedQ  DD: 07/13/2007  DT: 07/14/2007  Job #: [id number]   cc:   Julieanne Manson

## 2010-10-16 NOTE — Assessment & Plan Note (Signed)
OFFICE VISIT   [name], Benjamin Mueller  DOB:  May 08, 1927                                       03/03/2008  ZOXWR#:[id number]   The patient returned to the office today for continued surveillance of  his abdominal aorta.  He has atherosclerosis with a known ulcerated  plaque and mild dilatation of his infrarenal aorta.  He denies any  abdominal pain or back pain.   Ultrasound of his aorta reveals stable size measuring 2.4 cm.   The patient appears well.  Alert and oriented.  No distress.  BP 153/70,  pulse 50 per minute.  Abdomen soft, nontender.  No masses or  organomegaly.  No abdominal bruits.  Intact femoral pulses bilaterally.   The patient continues to do well without complicating features  associated with infrarenal atherosclerotic aortic ulcer.  We will plan  followup in 1 year with a CT scan of the abdomen.   Balinda Quails, M.D.  Electronically Signed   PGH/MEDQ  D:  03/03/2008  T:  03/04/2008  Job:  [id number]

## 2010-10-16 NOTE — Assessment & Plan Note (Signed)
[facility]                            CARDIOLOGY OFFICE NOTE   DAVISON, OHMS                     MRN:          [id number]  DATE:01/27/2008                            DOB:          December 16, 1926    [name] (stepfather of Dr. Julieanne Manson) is doing well.  I saw  him last in February 2009.  Intermittently, he has blood pressure  spikes.  Recently, he has done well.  We know his LV function is normal.  He has minimal aortic root dilatation.  He has been stable overtime.  His last echo was done in December 2008.  He feels well.  He is here  with his wife.   PAST MEDICAL HISTORY:   ALLERGIES:  No known drug allergies.   MEDICATIONS:  1. Aspirin.  2. Quinapril 40.  3. Metoprolol succinate 25.  4. Amlodipine 5.   OTHER:  Medical problems see the complete list on my note of May 22, 2007.   REVIEW OF SYSTEMS:  Review of systems is negative.   PHYSICAL EXAMINATION:  VITAL SIGNS:  Blood pressure is 128/56 with a  pulse of 52.  GENERAL:  The patient is oriented to person, time and place.  Affect is  normal.  HEENT:  No xanthelasma.  He has normal extraocular motion.  NECK:  There are no carotid bruits.  There is no jugular venous  distention.  LUNGS:  Clear.  Respiratory effort is not labored.  CARDIAC:  An S1with an S2.  There are no clicks or significant murmurs.  ABDOMEN:  Soft.  EXTREMITIES:  He has no peripheral edema.   Problems are listed completely on the note of May 22, 2007.  Blood  pressure is controlled.  He is stable overall.  I do not hear any  significant aortic insufficiency.  By history, he has had some mild AI.  I will see him for cardiology followup in 6 months.  He needs no test at  this time.  He did have an EKG revealing sinus rhythm with sinus  bradycardia.     Luis Abed, MD, [facility]  Electronically Signed    JDK/MedQ  DD: 01/27/2008  DT: 01/28/2008  Job #: [id number]   cc:   Julieanne Manson

## 2010-10-16 NOTE — Procedures (Signed)
DUPLEX ULTRASOUND OF ABDOMINAL AORTA   INDICATION:  Follow up aortic dilatation.   HISTORY:  Diabetes:  No.  Cardiac:  No.  Hypertension:  Yes.  Smoking:  Quit.  Connective Tissue Disorder:  Family History:  No.  Previous Surgery:  No.   DUPLEX EXAM:         AP (cm)                   TRANSVERSE (cm)  Proximal             2.09 Cm                   2.12 cm  Mid                  2.2 cm                    2.47 cm  Distal               1.54 cm                   1.81 cm  Right Iliac          0.91 cm                   0.9 cm  Left Iliac           0.91 cm                   0.98 cm   PREVIOUS:  Date: 03/05/07 (CT)  AP:  2.5  TRANSVERSE:   IMPRESSION:  Stable aortic dilatation with known ulcerative plaque.   ___________________________________________  P. Liliane Bade, M.D.   AS/MEDQ  D:  03/03/2008  T:  03/03/2008  Job:  [id number]

## 2010-10-16 NOTE — Assessment & Plan Note (Signed)
[facility]                            CARDIOLOGY OFFICE NOTE   ATHEL, MERRIWEATHER                     MRN:          [id number]  DATE:05/13/2007                            DOB:          1926-11-01    [name] is here for the followup of his blood pressure and slight  discomfort in his left arm.  I have followed him over time.  He has  always been very active.  He has had problems with high blood pressure  in the past.  Over time, we decided that his blood pressure would spike  if there was any type of emotional input, including being in a crowded  place.  Most recently, he has had his blood pressure checked, and it has  been increased.  In addition, there is a question of marked elevation in  his right arm.  There is a history of some stenosis of the right  subclavian/axillary artery but not the left.  Also, he has had an  ulcerated plaque in the posterior aorta that was seen by Dr. Madilyn Fireman, and  it was felt that he did not need any further evaluation.  Today, in  addition to the blood pressure findings, he had a slight sensation of  numbness in his left arm, and he is now here for evaluation.   PAST MEDICAL HISTORY:  Other medical problems, see the list below.   ALLERGIES:  No known drug allergies.   MEDICATIONS:  Aspirin 81, Quinapril 40.   REVIEW OF SYSTEMS:  Other than the HPI, his review of systems is  negative.   PHYSICAL EXAMINATION:  When he first came in, blood pressure in the left  arm was 180/50, and his right arm was as high as 220/70 with a pulse of  76.  His pressure was rechecked over time, and there still continued to  be some variation from the right arm to the left.  I decided to give him  a dose of beta blocker.  We had Coreg available, and he was given 12.5  mg of Coreg.  His pressure is coming down, and his arm is slightly  better, although it is not a marked discomfort that he has at this time.  HEENT:  No xanthelasma.   He does have a skin lesion on the top of his  head that is old.  Has normal extraocular motion.  NECK:  There are no carotid bruits.  There is no jugular venous  distention.  LUNGS:  Clear.  Respiratory effort is not labored.  CARDIAC:  An S1 with an S2.  There are no clicks or significant murmurs.  ABDOMEN:  Soft.  There are no masses or bruits.  He has no peripheral edema.  He has normal strength in both arms.   PROBLEMS:  1. Decreased hearing.  2. Decreased pulses in his feet historically but normal Dopplers.  3. Hypertension.  This has been treated in the past, and now it seems      that he has had some return of his hypertension.  I believe there  are two components to it, one of which is a situational one, and      his pressure is high today.  4. History of upper limits of normal in his thoracic aorta, and we      will arrange for a follow-up 2D echocardiogram.  5. 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  6. Moderate-to-large herniated disk at T11-12 that is stable      historically.  7. History of an ulcerated plaque along the posterior aorta but no      deep penetration.  Further workup was not recommended by Dr. Madilyn Fireman.  8. History of trace aortic insufficiency in the past.  We are going to      add a small dose of Norvasc and also a small dose of a beta blocker      and watch him carefully and see him for followup next week.  9. Slight discomfort in his left arm.  The exact etiology is not clear      to me, but he appears to be stable.  If he has any further      worsening problems with this, he will let us know.  Otherwise, I      will see him next week.     Luis Abed, MD, [facility]  Electronically Signed    JDK/MedQ  DD: 05/13/2007  DT: 05/14/2007  Job #: [id number]   cc:   Julieanne Manson

## 2010-10-16 NOTE — Assessment & Plan Note (Signed)
[facility]                            CARDIOLOGY OFFICE NOTE   JOSEALFREDO, ADKINS                     MRN:          [id number]  DATE:07/05/2008                            DOB:          09/03/1926    Mr. Lamp (stepfather of Dr. Julieanne Manson) is doing well.  I saw  him last on January 27, 2008.  He has an area of atherosclerosis in his  aorta with an ulcerated plaque and mild dilatation of the infrarenal  aorta.  He saw Dr. Liliane Bade for followup with an ultrasound.  This  area is all stable.  The patient is not having any significant chest  pain.  His blood pressure has been under good control.  He is going  about full activities.  He does have known hypertension.   PAST MEDICAL HISTORY:   ALLERGIES:  No known drug allergies.   MEDICATIONS:  Aspirin, quinapril, amlodipine, metoprolol, and vitamin D.   OTHER MEDICAL PROBLEMS:  See the list below.   REVIEW OF SYSTEMS:  He has no GI or GU complaints.  He has no headaches.  There are no fevers or chills.  He has no skin rashes.  Otherwise, his  review of systems is negative.   PHYSICAL EXAMINATION:  VITAL SIGNS:  Blood pressure is 138/48 with a  pulse of 50.  GENERAL:  The patient is oriented to person, time, and place.  Affect is  normal.  He is here with his wife.  HEENT:  No xanthelasma.  He has a skin lesion on top of his head.  NECK:  There is no jugular venous distention.  There is either a bruit  or radiated murmur heard in the neck.  LUNGS:  Clear.  Respiratory effort is not labored.  CARDIAC:  An S1 with an S2.  There are no clicks or significant murmurs.  ABDOMEN:  Soft.  EXTREMITIES:  There is no peripheral edema.   EKG reveals sinus bradycardia.   PROBLEMS:  1. Mild sinus bradycardia.  We will still continue his beta-blocker.      He is totally asymptomatic and he is doing well.  2. Hypertension.  This is now nicely controlled.  3. Mild increase in the size of his  thoracic aorta that is followed.  4. Ulcerated plaque in the posterior aorta and mild infrarenal      abdominal aortic aneurysm followed carefully by Dr. Liliane Bade.  5. Question of bruit in the left carotid.  It is possible this is a      radiated murmur.  I have checked carefully and I do not have      records of having done a carotid Doppler.  This will be arranged.   The patient will have a 2-D echo to follow up his aortic valve disease  and his aortic root size.  In addition, he will have Dopplers of his  carotids.  I will then see him for followup.     Luis Abed, MD, [facility]  Electronically Signed    JDK/MedQ  DD: 07/05/2008  DT: 07/06/2008  Job #: [id number]  cc:   Julieanne Manson

## 2010-10-16 NOTE — Assessment & Plan Note (Signed)
OFFICE VISIT   Benjamin Mueller, Benjamin Mueller  DOB:  03/19/1927                                       03/05/2007  OACZY#:[id number]   The patient returns to the office today for continued surveillance of  his abdominal aorta.  He has a history of an ulcerated plaque in the  infrarenal aorta.  CT scan was performed prior to his visit today.  This  again verifies a focal area of ulceration and bulging in the infrarenal  aorta measuring 2.5 cm.  No significant change is noted.   The patient appears generally well.  His blood pressure is 141/59, pulse  51 per minute and regular, respirations 16 per minute.  Abdomen is soft  and nontender.  No mass or organomegaly.  2+ femoral pulses bilaterally.   No significant change noted in infrarenal aortic atherosclerotic ulcer.  We will plan to follow up in 1 year with repeat CT scan.   Balinda Quails, M.D.  Electronically Signed   PGH/MEDQ  D:  03/05/2007  T:  03/06/2007  Job:  [id number]   cc:   Luis Abed, MD, [facility]  [facility]

## 2010-10-16 NOTE — Assessment & Plan Note (Signed)
OFFICE VISIT   Benjamin Mueller, Benjamin Mueller  DOB:  1926/08/20                                       03/24/2009  EAVWU#:[id number]   The patient presents today for continued follow-up of his irregularity  in his infrarenal aorta.  He had been followed in the past by Dr. Madilyn Fireman  and ongoing follow-up since 2006.  At that time he had a CT scan which  showed incidental finding of any irregularity and mild ectasia of the  distal aorta above the aortic bifurcation.  He has been having  alternating ultrasound and CT scans since that time.  He has had no  symptoms referable to this.  He has a history of hypertension, but no  evidence of any aortic dissection.  He also has lower extremity venous  varicosities and these are stable.   REVIEW OF SYSTEMS:  Denies any new cardiac, pulmonary, GI or GU  dysfunction.  He continues to be a nonsmoker having quit 1967.  Does not  drink alcohol on regular basis.  He is married.  Denies premature  atherosclerotic disease in his family history.   PHYSICAL EXAM:  A well-developed white male, thin, appearing stated age  in no acute distress.  His neck has no carotid bruits, no JVD and no  thyroid masses.  His chest is clear bilaterally with no wheezes.  His  heart regular rate and rhythm.  Abdominal exam reveals no masses and  normal bowel sounds.  He does not have any organomegaly.  Pulse status:  He does have a 2+ femoral popliteal and distal pulses with no evidence  of popliteal artery aneurysms.  Neurologically is intact with no focal  deficits.  The skin is warm with no lesions or rashes.   He does have a CT scan today and I have reviewed this scan and discussed  with the patient and his wife.  I explained this does show no change in  the mild ectasia and no change in the ulceration of the distal area of  concern in his aorta.  I recommend that we continue to review this with  yearly ultrasound rather than alternating with CT scans  since this has  been quite stable for now 6 years.  They are comfortable with this  discussion.  Also they raised the issue of his venous varicosities.  He  had been suggested at an outlying vein center to have treatment of  these.  He is having mild discomfort currently, is not having any  further bleeding as he had in the past and is comfortable with continued  observation.  I did discuss treatment options with ablation, but he is  doing well with elevation and compression garments and will continue  this for now.   Larina Earthly, M.D.  Electronically Signed   TFE/MEDQ  D:  03/24/2009  T:  03/27/2009  Job:  [id number]   cc:   Julieanne Manson

## 2010-10-19 NOTE — Assessment & Plan Note (Signed)
[facility]                            CARDIOLOGY OFFICE NOTE   Benjamin Mueller, Benjamin Mueller                     MRN:          [id number]  DATE:06/11/2006                            DOB:          [birth year]    Benjamin Mueller is here for follow-up (Benjamin Mueller's stepfather).  He is doing well.  He continues to exercise well.  His blood pressure is  controlled.  He is not having any major chest pain.  We know from the  past that he has blood pressure spikes sometimes when he is in a large  group.  He had ulceration of the posterior aorta and has seen Benjamin Mueller  for this.  I will look into the follow-up that is in place.   ALLERGIES:  No known drug allergies.   MEDICATIONS:  1. Aspirin 81 mg.  2. Quinapril 40 mg daily.   PAST MEDICAL HISTORY:  See the list below.   REVIEW OF SYSTEMS:  He feels great and his review of systems is  negative.   PHYSICAL EXAMINATION:  VITAL SIGNS:  Blood pressure is 145/58, pulse is  47.  GENERAL:  The patient is oriented to person, time, and place.  Affect is  normal.  He has no xanthelasma.  He is wearing a hearing aid.  There are  no carotid bruits.  There is no jugular venous distention.  HEART:  S1 with S2.  There are no clicks or significant murmurs.  ABDOMEN:  Soft.  He has no peripheral edema.  There are no  musculoskeletal deformities.   EKG shows no change.  He has sinus bradycardia.  He has decreased  anterior R wave progression and this is old.   PROBLEMS:  1. Decreased hearing.  2. Decreased pulses in his feet, but normal Dopplers.  3. Hypertension, treated.  4. Upper limits of normal thoracic aorta.  5. Some stenosis of the right subclavian axillary artery that can be      followed over time.  6. Moderate to large herniated disk at T11-12, but stable.  7. History of an ulcerated plaque along the posterior aorta, but no      deep penetration.  I will      check into the follow-up of his abdominal  aorta.  Otherwise I can      see him back in 18 months.     Benjamin Abed, MD, [facility]  Electronically Signed    JDK/MedQ  DD: 06/11/2006  DT: 06/11/2006  Job #: [id number]   cc:   Benjamin Mueller, M.D., [facility]

## 2010-10-24 ENCOUNTER — Telehealth: Payer: Self-pay | Admitting: Cardiology

## 2010-10-24 NOTE — Telephone Encounter (Signed)
Pt had several tests done in April but once he got home he decided not to wear the monitor.  They  Brought this back without even opening the box.  They have received an EOB from the insurance company stating that it had been processed.  What should she do?  She called the insurance company and they told her to call here and ask for insurance dept.

## 2010-12-07 ENCOUNTER — Encounter: Payer: Self-pay | Admitting: Cardiology

## 2010-12-07 ENCOUNTER — Ambulatory Visit: Payer: Medicare Other | Admitting: Cardiology

## 2010-12-07 ENCOUNTER — Ambulatory Visit (INDEPENDENT_AMBULATORY_CARE_PROVIDER_SITE_OTHER): Payer: Medicare Other | Admitting: Cardiology

## 2010-12-07 DIAGNOSIS — R55 Syncope and collapse: Secondary | ICD-10-CM

## 2010-12-07 DIAGNOSIS — I495 Sick sinus syndrome: Secondary | ICD-10-CM

## 2010-12-07 DIAGNOSIS — I10 Essential (primary) hypertension: Secondary | ICD-10-CM

## 2010-12-07 MED ORDER — AMLODIPINE BESYLATE 5 MG PO TABS: 10.0000 mg | ORAL_TABLET | Freq: Every day | ORAL | Status: DC

## 2010-12-07 NOTE — Assessment & Plan Note (Signed)
The patient's beta blocker will be stopped and his amlodipine dose will be increased and will follow his blood pressure.

## 2010-12-07 NOTE — Assessment & Plan Note (Signed)
As his beta blocker he stopped other meds will be adjusted.

## 2010-12-07 NOTE — Patient Instructions (Signed)
Stop Bystolic Increase Amlodipine to 10 mg daily  You have been referred to Neurology  Your physician recommends that you schedule a follow-up appointment in: 8 weeks
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
## 2011-02-01 ENCOUNTER — Encounter: Payer: Self-pay | Admitting: Cardiology

## 2011-02-01 ENCOUNTER — Ambulatory Visit (INDEPENDENT_AMBULATORY_CARE_PROVIDER_SITE_OTHER): Payer: Medicare Other | Admitting: Cardiology

## 2011-02-01 DIAGNOSIS — I359 Nonrheumatic aortic valve disorder, unspecified: Secondary | ICD-10-CM

## 2011-02-01 DIAGNOSIS — I10 Essential (primary) hypertension: Secondary | ICD-10-CM

## 2011-02-01 DIAGNOSIS — R55 Syncope and collapse: Secondary | ICD-10-CM | POA: Insufficient documentation

## 2011-02-01 DIAGNOSIS — I8390 Asymptomatic varicose veins of unspecified lower extremity: Secondary | ICD-10-CM | POA: Insufficient documentation

## 2011-02-01 DIAGNOSIS — R609 Edema, unspecified: Secondary | ICD-10-CM

## 2011-02-01 DIAGNOSIS — I350 Nonrheumatic aortic (valve) stenosis: Secondary | ICD-10-CM

## 2011-02-01 NOTE — Progress Notes (Signed)
HPI The patient is seen today for followup an episode of dizziness and presyncope.  I had seen him on December 07, 2010.  He had a spell on the TIA was being considered.  He stabilized.  He does have aortic stenosis but it's only mild.  I did not think it was from severe bradycardia.  Eventually the plan was made for him to see neurology.  He was seen on one occasion.  He felt that communication was difficult to date.  He tends to be very emotional about visiting doctors.  At this point he does not want to followup with neurology since he is stable.  He's not having any chest pain.  He is also not having any shortness of breath.  His volume status is stable. No Known Allergies  Current Outpatient Prescriptions  Medication Sig Dispense Refill  . ALPRAZolam (XANAX) 0.5 MG tablet Take 0.5 mg by mouth as needed.        Marland Kitchen amLODipine (NORVASC) 5 MG tablet Take 2 tablets (10 mg total) by mouth daily.  30 tablet  6  . aspirin 81 MG tablet Take 81 mg by mouth daily.        . Cholecalciferol (VITAMIN D3) 1000 UNITS tablet Take 1,000 Units by mouth daily.        . furosemide (LASIX) 40 MG tablet 20 mg. 1 tab M, W, F      . hydrochlorothiazide 25 MG tablet Take 25 mg by mouth daily.        . quinapril (ACCUPRIL) 40 MG tablet Take 40 mg by mouth at bedtime.          History   Social History  . Marital Status: Married    Spouse Name: N/A    Number of Children: N/A  . Years of Education: N/A   Occupational History  . Not on file.   Social History Main Topics  . Smoking status: Former Smoker    Quit date: 06/03/1970  . Smokeless tobacco: Not on file  . Alcohol Use: No  . Drug Use: No  . Sexually Active: Not on file   Other Topics Concern  . Not on file   Social History Narrative  . No narrative on file    No family history on file.  Past Medical History  Diagnosis Date  . CAROTID ARTERY DISEASE 08/20/2009    doppler 2/10:  0-39% bilat ICA  / Doppler, September 21, 2010, stable, 0-39% bilateral    . Essential hypertension, benign 08/22/2008  . SINUS BRADYCARDIA 08/22/2008  . Edema 08/21/2009    Started January, 2011, probable mild volume overload and venous insufficiency  . Aortic stenosis     Mild, echo, March, 2011 / mild, echo, April, 2012  . Aortic insufficiency     Moderate, echo, March, 2011 / Moderate, echo, April, 2012  . Atherosclerosis     aorta; duplex 1/12: stable, no dissection, mid to distal irregular plaque with ulcerative appearance similar to prior study; severe stenosis IMA  . Aortic root dilation     Mild dilatation thoracic aorta  . Hemoptysis     Significant outpatient episode February 2 011, related to pneumonia by her  . Ejection fraction     60% echo March, 2011 / EF 60-65%,, September 21, 2010, echo  . Pneumonia     Right middle lobe, February, 2011 resolved  . Pre-syncope   . Varicose veins     Patient had a procedure related to the veins in his  legs, 2012    No past surgical history on file.  ROS  Patient denies fever, chills, headache, sweats, rash, change in vision.  His hearing is decreased and he wears hearing aids.  He denies chest pain, cough, nausea vomiting, urinary symptoms.  All of the systems are reviewed and are negative.  PHYSICAL EXAM Patient is stable.  He has kyphosis of the spine.  He is here with his wife today.  His hearing aid is in place.  Head is atraumatic.  There is no jugular venous distention.  Lungs are clear.  Respiratory effort is not labored.  Cardiac exam reveals S1 and S2.  There is a crescendo decrescendo systolic murmur.  Abdomen is soft.  He has no peripheral edema. Filed Vitals:   02/01/11 0947  BP: 135/60  Pulse: 67  Height: 5\' 9"  (1.753 m)  Weight: 134 lb (60.782 kg)    EKG Is not done today.  ASSESSMENT & PLAN

## 2011-02-01 NOTE — Patient Instructions (Signed)
Your physician wants you to follow-up in: 6 months with Dr Katz.(February 2013). You will receive a reminder letter in the mail two months in advance. If you don't receive a letter, please call our office to schedule the follow-up appointment.  

## 2011-02-01 NOTE — Assessment & Plan Note (Signed)
His aortic valve disease is mild.  No further workup at this time.

## 2011-02-01 NOTE — Assessment & Plan Note (Signed)
He does not have any significant edema.  No further workup.  We'll see him back in 6 months.

## 2011-02-01 NOTE — Assessment & Plan Note (Signed)
The patient had some type of venous procedure done in [place].  This was done to his legs.  He is stable.

## 2011-02-01 NOTE — Assessment & Plan Note (Signed)
The patient has not had any recurrent presyncope.  We will follow him clinically at this point.

## 2011-02-01 NOTE — Assessment & Plan Note (Signed)
Blood pressure stable. No change in therapy. 

## 2011-03-29 ENCOUNTER — Ambulatory Visit (INDEPENDENT_AMBULATORY_CARE_PROVIDER_SITE_OTHER): Payer: Medicare Other | Admitting: Vascular Surgery

## 2011-03-29 DIAGNOSIS — I700 Atherosclerosis of aorta: Secondary | ICD-10-CM

## 2011-04-04 ENCOUNTER — Ambulatory Visit (INDEPENDENT_AMBULATORY_CARE_PROVIDER_SITE_OTHER): Payer: Medicare Other | Admitting: Cardiology

## 2011-04-04 ENCOUNTER — Telehealth: Payer: Self-pay | Admitting: Cardiology

## 2011-04-04 ENCOUNTER — Encounter: Payer: Self-pay | Admitting: Cardiology

## 2011-04-04 DIAGNOSIS — I6529 Occlusion and stenosis of unspecified carotid artery: Secondary | ICD-10-CM

## 2011-04-04 DIAGNOSIS — I359 Nonrheumatic aortic valve disorder, unspecified: Secondary | ICD-10-CM

## 2011-04-04 DIAGNOSIS — I350 Nonrheumatic aortic (valve) stenosis: Secondary | ICD-10-CM

## 2011-04-04 DIAGNOSIS — I10 Essential (primary) hypertension: Secondary | ICD-10-CM

## 2011-04-04 DIAGNOSIS — R609 Edema, unspecified: Secondary | ICD-10-CM

## 2011-04-04 DIAGNOSIS — G459 Transient cerebral ischemic attack, unspecified: Secondary | ICD-10-CM

## 2011-04-04 NOTE — Progress Notes (Signed)
HPI The patient is seen today as an add-on.  His wife called to say that he had a period of decreased responsiveness today.  Office she describes sitting and talking with him.  He didn't appear to have a blank stare for a period of time.  He did not appear to lose complete consciousness.  He then began to feel better.  His blood pressure when first checked was elevated.  The systolic was in the range of 190.  More recently when checked at home his systolic pressure was in the range of 140.  Here in the office today his systolic is 170.   We know earlier this year in June, 2012 he had a spell on the TIA was considered.  He stabilized.  He has mild aortic stenosis.  His most recent carotid Doppler was done in April, 2012.  He had stable bilateral disease of 0-39%.  He has not had documented atrial fibrillation.  He has normal left ventricular function.  Etiology of his spells is not clear.  I had arranged for him to see neurology.  With this initial attempt he became anxious as he did not completely understand the full evaluation.  Therefore a neurologic evaluation could not be completed. No Known Allergies  Current Outpatient Prescriptions  Medication Sig Dispense Refill  . ALPRAZolam (XANAX) 0.5 MG tablet Take 0.5 mg by mouth as needed.        Marland Kitchen amLODipine (NORVASC) 5 MG tablet Take 2 tablets (10 mg total) by mouth daily.  30 tablet  6  . aspirin 81 MG tablet Take 81 mg by mouth daily.        . Cholecalciferol (VITAMIN D3) 1000 UNITS tablet Take 1,000 Units by mouth daily.        Marland Kitchen docusate sodium (COLACE) 100 MG capsule Take 100 mg by mouth as needed.        . furosemide (LASIX) 40 MG tablet 20 mg. 1 tab M, W, F      . hydrochlorothiazide 25 MG tablet Take 25 mg by mouth daily.        Marland Kitchen HYDROcodone-acetaminophen (NORCO) 5-325 MG per tablet Take 1 tablet by mouth every 4 (four) hours as needed.        . quinapril (ACCUPRIL) 40 MG tablet Take 40 mg by mouth at bedtime.          History   Social  History  . Marital Status: Married    Spouse Name: N/A    Number of Children: N/A  . Years of Education: N/A   Occupational History  . Not on file.   Social History Main Topics  . Smoking status: Former Smoker    Quit date: 06/03/1970  . Smokeless tobacco: Not on file  . Alcohol Use: No  . Drug Use: No  . Sexually Active: Not on file   Other Topics Concern  . Not on file   Social History Narrative  . No narrative on file    No family history on file.  Past Medical History  Diagnosis Date  . CAROTID ARTERY DISEASE 08/20/2009    doppler 2/10:  0-39% bilat ICA  / Doppler, September 21, 2010, stable, 0-39% bilateral  . Essential hypertension, benign 08/22/2008  . SINUS BRADYCARDIA 08/22/2008  . Edema 08/21/2009    Started January, 2011, probable mild volume overload and venous insufficiency  . Aortic stenosis     Mild, echo, March, 2011 / mild, echo, April, 2012  . Aortic insufficiency  Moderate, echo, March, 2011 / Moderate, echo, April, 2012  . Atherosclerosis     aorta; duplex 1/12: stable, no dissection, mid to distal irregular plaque with ulcerative appearance similar to prior study; severe stenosis IMA  . Aortic root dilation     Mild dilatation thoracic aorta  . Hemoptysis     Significant outpatient episode February 2 011, related to pneumonia by her  . Ejection fraction     60% echo March, 2011 / EF 60-65%,, September 21, 2010, echo  . Pneumonia     Right middle lobe, February, 2011 resolved  . Pre-syncope   . Varicose veins     Patient had a procedure related to the veins in his legs, 2012  . TIA (transient ischemic attack)     Possible TIA July, 2012, /  episode of staring April 04, 2011    No past surgical history on file.  ROS  Patient denies fever, chills, headache, sweats, rash, change in vision, change in hearing, chest pain, cough, nausea vomiting, urinary symptoms.  All other systems are reviewed and are negative PHYSICAL EXAM On physical exam he is  fully alert.  Head is atraumatic.  There is no jugulovenous distention.  There is no obvious neurologic abnormality.  Lungs are clear.  Respiratory effort is nonlabored.  Cardiac exam reveals S1-S2.  No clicks or significant murmurs.  Abdomen is soft there is no peripheral edema.  There is no musculoskeletal deformities.  There no skin rashes. Filed Vitals:   04/04/11 1452  BP: 174/56  Pulse: 77  Height: 5\' 9"  (1.753 m)  Weight: 131 lb (59.421 kg)  SpO2: 96%    EKG was not done today. ASSESSMENT & PLAN

## 2011-04-04 NOTE — Telephone Encounter (Signed)
SPOKE WITH WIFE  PT'S B/P  WAS 191/52 HR 73   AND AFTER RECHECK  WAS 194/59  HR  75   HAD ONE EPISODE AT LUNCH  WITH PT  DAZING AND FEELING FUNNY NO OTHER S/S  PER PT FEELS FINE AT THIS TIME  DUE TO PT'S HEARING   HAS NOT F/U WITH NEURO  DISCUSSED WITH DR Myrtis Ser WILL SEE PT THIS AFTERNOON PT'S WIFE AWARE WILL  COME IN AT 2:00PM TO SEE DR KATZ  TODAY ./CY

## 2011-04-04 NOTE — Assessment & Plan Note (Signed)
He does not have any significant ongoing edema.

## 2011-04-04 NOTE — Telephone Encounter (Signed)
New message:  Pt  Eating lunch and became dazed.  No numbness/no slurred speech/just felt funny.  Blood pressure was 191/52.  He is better now, but just doesn't feel quite right.  Please call them back.

## 2011-04-04 NOTE — Assessment & Plan Note (Signed)
Over time he has had marked variations in blood pressure.  We know from the past that he becomes anxious and his blood pressure goes up.  His blood pressure was elevated today.  However in general his pressures running in the range of 140 systolic.  I am very hesitant to change his medicines.  I'm afraid that I could make him worse.  His pressures to be monitored by his wife at home

## 2011-04-04 NOTE — Assessment & Plan Note (Signed)
The patient has had some type of spells that continued to go unexplained. In March, 2012 he was not able to wear a Holter monitor.  He was not able to tolerate the initial neurologic evaluation.  His aortic stenosis is mild.  I am not convinced that these are cardiac problems that he is having.  I would like for [place] to try to find a way to complete a neurologic evaluation.  I will speak with his step son in law who is a primary care physician in [place].  I will look into whether we can do some explaining and arrange for a way to get him evaluated neurologically.

## 2011-04-04 NOTE — Patient Instructions (Signed)
Your physician recommends that you schedule a follow-up appointment in: 6 months  

## 2011-04-04 NOTE — Assessment & Plan Note (Signed)
The patient has mild aortic stenosis by echo.  This was re documented in 2011.  He does not eat a followup echo.

## 2011-04-04 NOTE — Assessment & Plan Note (Signed)
We know that he has only mild carotid disease.

## 2011-04-12 ENCOUNTER — Encounter: Payer: Self-pay | Admitting: Vascular Surgery

## 2011-04-12 ENCOUNTER — Ambulatory Visit
Admission: RE | Admit: 2011-04-12 | Discharge: 2011-04-12 | Disposition: A | Discharge status: Home / Self Care | Payer: Medicare Other | Source: Ambulatory Visit | Attending: Neurology | Admitting: Neurology

## 2011-04-12 ENCOUNTER — Other Ambulatory Visit: Payer: Self-pay | Admitting: Neurology

## 2011-04-12 DIAGNOSIS — R600 Localized edema: Secondary | ICD-10-CM

## 2011-04-12 DIAGNOSIS — F0390 Unspecified dementia without behavioral disturbance: Secondary | ICD-10-CM

## 2011-04-23 NOTE — Procedures (Unsigned)
DUPLEX ULTRASOUND OF ABDOMINAL AORTA  INDICATION:  Aortic dilatation with ulcerative plaque.  HISTORY: Diabetes:  No Cardiac:  No Hypertension:  Yes Smoking:  Previous Connective Tissue Disorder: Family History:  No Previous Surgery:  No  DUPLEX EXAM:         AP (cm)                   TRANSVERSE (cm) Proximal             2.06 cm                   2.20 cm Mid                  2.46 cm                   2.45 cm Distal               1.67 cm                   1.77 cm Right Iliac          1.09 cm                   1.01 cm Left Iliac           1.14 cm                   1.12 cm  PREVIOUS:  Date:  03/03/2008  AP:  2.2  TRANSVERSE:  2.47  IMPRESSION: 1. Dilatation of the mid/distal segment of the abdominal aorta present     measuring 2.46 cm x 2.45 cm, with ulcerative plaque formation     present. 2. Essentially unchanged since previous study on 03/03/2008.  ___________________________________________ Larina Earthly, M.D.  SH/MEDQ  D:  03/29/2011  T:  03/29/2011  Job:  [id number]

## 2011-05-01 ENCOUNTER — Other Ambulatory Visit: Payer: Self-pay | Admitting: Cardiology

## 2011-05-06 ENCOUNTER — Other Ambulatory Visit: Payer: Self-pay | Admitting: Cardiology

## 2011-05-07 ENCOUNTER — Encounter: Payer: Self-pay | Admitting: Cardiology

## 2011-05-07 NOTE — Progress Notes (Signed)
The patient has been evaluated by Dr. Sandria Manly of neurology.  His note is scant into this record under the media section dated April 19, 2011.  A CT scan was ordered.  It showed evidence of atrophy and small vessel disease without hydrocephalus, large stroke, or tumor.  The patient is on Aricept 5 mg and after one month will go to 10 mg if he is tolerating.

## 2011-07-18 ENCOUNTER — Ambulatory Visit
Admission: RE | Admit: 2011-07-18 | Discharge: 2011-07-18 | Disposition: A | Discharge status: Home / Self Care | Payer: Medicare Other | Source: Ambulatory Visit | Attending: Neurology | Admitting: Neurology

## 2011-07-18 ENCOUNTER — Other Ambulatory Visit: Payer: Self-pay | Admitting: Neurology

## 2011-07-18 DIAGNOSIS — M549 Dorsalgia, unspecified: Secondary | ICD-10-CM

## 2011-08-08 ENCOUNTER — Encounter: Payer: Self-pay | Admitting: Cardiology

## 2011-08-08 ENCOUNTER — Ambulatory Visit (INDEPENDENT_AMBULATORY_CARE_PROVIDER_SITE_OTHER): Payer: Medicare Other | Admitting: Cardiology

## 2011-08-08 VITALS — BP 122/46 | HR 72 | Ht 67.0 in | Wt 133.0 lb

## 2011-08-08 DIAGNOSIS — R55 Syncope and collapse: Secondary | ICD-10-CM

## 2011-08-08 DIAGNOSIS — I495 Sick sinus syndrome: Secondary | ICD-10-CM

## 2011-08-08 DIAGNOSIS — I10 Essential (primary) hypertension: Secondary | ICD-10-CM

## 2011-08-08 DIAGNOSIS — R609 Edema, unspecified: Secondary | ICD-10-CM

## 2011-08-08 DIAGNOSIS — I359 Nonrheumatic aortic valve disorder, unspecified: Secondary | ICD-10-CM

## 2011-08-08 DIAGNOSIS — I350 Nonrheumatic aortic (valve) stenosis: Secondary | ICD-10-CM

## 2011-08-08 NOTE — Progress Notes (Signed)
HPI Patient is seen to followup hypertension and aortic stenosis. He's been relatively stable. He recently tried to lift something at home and fell. He did not have syncope. There were no major injuries. He's not having any chest pain. He's not complaining of shortness of breath. His wife says that he may look like he is short of breath at times. There is no PND or orthopnea.  No Known Allergies  Current Outpatient Prescriptions  Medication Sig Dispense Refill  . ALPRAZolam (XANAX) 0.5 MG tablet Take 0.5 mg by mouth as needed.        Marland Kitchen amLODipine (NORVASC) 5 MG tablet TAKE 2 TABLETS (10 MG TOTAL)  BY MOUTH DAILY.  90 tablet  2  . aspirin 81 MG tablet Take 81 mg by mouth daily.        Marland Kitchen docusate sodium (COLACE) 100 MG capsule Take 100 mg by mouth as needed.        . donepezil (ARICEPT) 5 MG tablet Take 5 mg by mouth at bedtime.      . furosemide (LASIX) 40 MG tablet 20 mg. 1 tab M, W, F      . hydrochlorothiazide 25 MG tablet Take 25 mg by mouth daily.        . meloxicam (MOBIC) 7.5 MG tablet Take 7.5 mg by mouth daily as needed.      . quinapril (ACCUPRIL) 40 MG tablet 1 ONCE DAILY  90 tablet  2    History   Social History  . Marital Status: Married    Spouse Name: N/A    Number of Children: N/A  . Years of Education: N/A   Occupational History  . Not on file.   Social History Main Topics  . Smoking status: Former Smoker    Quit date: 06/03/1970  . Smokeless tobacco: Not on file  . Alcohol Use: No  . Drug Use: No  . Sexually Active: Not on file   Other Topics Concern  . Not on file   Social History Narrative  . No narrative on file    No family history on file.  Past Medical History  Diagnosis Date  . CAROTID ARTERY DISEASE 08/20/2009    doppler 2/10:  0-39% bilat ICA  / Doppler, September 21, 2010, stable, 0-39% bilateral  . Essential hypertension, benign 08/22/2008  . SINUS BRADYCARDIA 08/22/2008  . Edema 08/21/2009    Started January, 2011, probable mild volume  overload and venous insufficiency  . Aortic stenosis     Mild, echo, March, 2011 / mild, echo, April, 2012  . Aortic insufficiency     Moderate, echo, March, 2011 / Moderate, echo, April, 2012  . Atherosclerosis     aorta; duplex 1/12: stable, no dissection, mid to distal irregular plaque with ulcerative appearance similar to prior study; severe stenosis IMA  . Aortic root dilation     Mild dilatation thoracic aorta  . Hemoptysis     Significant outpatient episode February 2 011, related to pneumonia by her  . Ejection fraction     60% echo March, 2011 / EF 60-65%,, September 21, 2010, echo  . Pneumonia     Right middle lobe, February, 2011 resolved  . Pre-syncope   . Varicose veins     Patient had a procedure related to the veins in his legs, 2012  . TIA (transient ischemic attack)     Possible TIA July, 2012, /  episode of staring April 04, 2011    No past surgical history  on file.  ROS Patient denies fever, chills, headache, sweats, rash, change in vision, change in hearing, chest pain, cough, nausea vomiting, urinary symptoms. All other systems are reviewed and are negative.  PHYSICAL EXAM   Patient is oriented to person time and place. His hearing aid is in place. He has a mild scab peeling on the top of his head. There is no jugular venous distention. Lungs are clear. Respiratory effort is nonlabored. Cardiac exam reveals S1 and S2. There is a 3/6 crescendo decrescendo systolic murmur of aortic stenosis. The second heart sound is heard. There is kyphosis of the spine. The abdomen is soft. There is no peripheral edema. There are notes in rashes.  Filed Vitals:   08/08/11 1005  BP: 122/46  Pulse: 72  Height: 5\' 7"  (1.702 m)  Weight: 133 lb (60.328 kg)   EKG is done today and reviewed by me. There is normal sinus rhythm.  ASSESSMENT & PLAN

## 2011-08-08 NOTE — Patient Instructions (Signed)
Your physician wants you to follow-up in:  6 months. You will receive a reminder letter in the mail two months in advance. If you don't receive a letter, please call our office to schedule the follow-up appointment.   

## 2011-08-08 NOTE — Assessment & Plan Note (Signed)
Patient has not had a recurrent edema. No change in therapy.

## 2011-08-08 NOTE — Assessment & Plan Note (Signed)
The patient has mild aortic stenosis and mild aortic insufficiency. He does not need a followup echo at this time.

## 2011-08-08 NOTE — Assessment & Plan Note (Signed)
Blood pressures control. No change in therapy. 

## 2011-08-08 NOTE — Assessment & Plan Note (Signed)
Patient has not had a recurrent presyncope. He is seeing Dr. Sandria Manly for neurology.

## 2011-08-12 ENCOUNTER — Other Ambulatory Visit: Payer: Self-pay | Admitting: *Deleted

## 2011-08-12 MED ORDER — AMLODIPINE BESYLATE 5 MG PO TABS: 5.0000 mg | ORAL_TABLET | Freq: Every day | ORAL | Status: DC

## 2011-08-19 ENCOUNTER — Telehealth: Payer: Self-pay | Admitting: Cardiology

## 2011-08-19 NOTE — Telephone Encounter (Signed)
[phone] cell can also be reached at

## 2011-08-19 NOTE — Telephone Encounter (Signed)
Mr Benjamin Mueller had a carotid doppler done last year and the doppler states recheck in two years.  He received a call to schedule one this year.  [name] wants to know if he needs a doppler this year or next year?

## 2011-08-19 NOTE — Telephone Encounter (Signed)
Please return call to patient wife [phone]  Patient wife Benjamin Mueller would like to discuss patient carotid exam, she questions the necessity at this point.  She can be reached at hm# [phone].

## 2011-09-14 ENCOUNTER — Other Ambulatory Visit: Payer: Self-pay | Admitting: Cardiology

## 2011-09-25 ENCOUNTER — Other Ambulatory Visit: Payer: Self-pay | Admitting: Cardiology

## 2011-09-25 MED ORDER — AMLODIPINE BESYLATE 5 MG PO TABS: 10.0000 mg | ORAL_TABLET | Freq: Every day | ORAL | Status: DC

## 2011-10-23 ENCOUNTER — Emergency Department (HOSPITAL_COMMUNITY)
Admission: EM | Admit: 2011-10-23 | Discharge: 2011-10-23 | Disposition: A | Discharge status: Home / Self Care | Payer: Medicare Other | Attending: Emergency Medicine | Admitting: Emergency Medicine

## 2011-10-23 ENCOUNTER — Encounter (HOSPITAL_COMMUNITY): Payer: Self-pay | Admitting: Family Medicine

## 2011-10-23 DIAGNOSIS — L851 Acquired keratosis [keratoderma] palmaris et plantaris: Secondary | ICD-10-CM | POA: Insufficient documentation

## 2011-10-23 DIAGNOSIS — F0390 Unspecified dementia without behavioral disturbance: Secondary | ICD-10-CM | POA: Insufficient documentation

## 2011-10-23 DIAGNOSIS — X58XXXA Exposure to other specified factors, initial encounter: Secondary | ICD-10-CM | POA: Insufficient documentation

## 2011-10-23 DIAGNOSIS — I10 Essential (primary) hypertension: Secondary | ICD-10-CM | POA: Insufficient documentation

## 2011-10-23 DIAGNOSIS — T792XXA Traumatic secondary and recurrent hemorrhage and seroma, initial encounter: Secondary | ICD-10-CM | POA: Insufficient documentation

## 2011-10-23 DIAGNOSIS — I2510 Atherosclerotic heart disease of native coronary artery without angina pectoris: Secondary | ICD-10-CM | POA: Insufficient documentation

## 2011-10-23 DIAGNOSIS — T148XXA Other injury of unspecified body region, initial encounter: Secondary | ICD-10-CM

## 2011-10-23 DIAGNOSIS — R609 Edema, unspecified: Secondary | ICD-10-CM | POA: Insufficient documentation

## 2011-10-23 LAB — DIFFERENTIAL
Basophils Relative: 0 % (ref 0–1)
Eosinophils Absolute: 0.1 10*3/uL (ref 0.0–0.7)
Eosinophils Relative: 1 % (ref 0–5)
Lymphs Abs: 0.6 10*3/uL — ABNORMAL LOW (ref 0.7–4.0)
Monocytes Absolute: 1.2 10*3/uL — ABNORMAL HIGH (ref 0.1–1.0)
Monocytes Relative: 10 % (ref 3–12)
Neutrophils Relative %: 84 % — ABNORMAL HIGH (ref 43–77)

## 2011-10-23 LAB — CBC
HCT: 32.2 % — ABNORMAL LOW (ref 39.0–52.0)
Hemoglobin: 10.9 g/dL — ABNORMAL LOW (ref 13.0–17.0)
MCH: 31.9 pg (ref 26.0–34.0)
MCHC: 33.9 g/dL (ref 30.0–36.0)
MCV: 94.2 fL (ref 78.0–100.0)

## 2011-10-23 LAB — BASIC METABOLIC PANEL
BUN: 15 mg/dL (ref 6–23)
Calcium: 9.7 mg/dL (ref 8.4–10.5)
Creatinine, Ser: 0.89 mg/dL (ref 0.50–1.35)
GFR calc Af Amer: 90 mL/min — ABNORMAL LOW (ref >90)
GFR calc non Af Amer: 77 mL/min — ABNORMAL LOW (ref >90)
Glucose, Bld: 124 mg/dL — ABNORMAL HIGH (ref 70–99)

## 2011-10-23 MED ORDER — SODIUM CHLORIDE 0.9 % IV BOLUS (SEPSIS)
500.0000 mL | Freq: Once | INTRAVENOUS | Status: AC
  Administered 2011-10-23: 500 mL via INTRAVENOUS

## 2011-10-23 NOTE — ED Notes (Signed)
Urine collected and placed at bedside.  

## 2011-10-23 NOTE — ED Notes (Signed)
Pt states wife went home to bring him a change of clothes and will be back shortly to take him home after discharge. NAD noted at this time.

## 2011-10-23 NOTE — ED Notes (Signed)
JYN:[id number]<AO> Expected date:<BR> Expected time:<BR> Means of arrival:<BR> Comments:<BR> Ems/ bleeding puncture wound

## 2011-10-23 NOTE — ED Notes (Signed)
Had an vein ablasion per family done in 2011 has not had anything done since then, md cates see pt for cardiology. Unknown any injury was sitting in bathroom when ems arrived with a large amount of blood loss with a trail from his bedroom to the bathroom with large puddles on the trail and in the bathroom. Pt was diaphoretic when ems on arrival blood was "gushing" out of his rt ankle pin point area on the inside of the ankle. Blood controlled at this time, not on any blood thinners. Family giving hx pt has dementia

## 2011-10-23 NOTE — ED Notes (Signed)
Pinpoint puncture area noted to right medial ankle, bleeding controlled at this time. Lower extremities cool to touch, pale. Pt able to wiggle digits. NAD noted at this time.

## 2011-10-23 NOTE — Discharge Instructions (Signed)
You can increase the dose of Colace to twice a day.  You may also want to consider adding MiraLAX daily to assist with the constipation.  Regarding the patient's bleeding, if it reoccurs please hold gentle pressure for 10 minutes as this will stop most bleeding.  Constipation in Adults Constipation is having fewer than 2 bowel movements per week. Usually, the stools are hard. As we grow older, constipation is more common. If you try to fix constipation with laxatives, the problem may get worse. This is because laxatives taken over a long period of time make the colon muscles weaker. A low-fiber diet, not taking in enough fluids, and taking some medicines may make these problems worse. MEDICATIONS THAT MAY CAUSE CONSTIPATION  Water pills (diuretics).   Calcium channel blockers (used to control blood pressure and for the heart).   Certain pain medicines (narcotics).   Anticholinergics.   Anti-inflammatory agents.   Antacids that contain aluminum.  DISEASES THAT CONTRIBUTE TO CONSTIPATION  Diabetes.   Parkinson's disease.   Dementia.   Stroke.   Depression.   Illnesses that cause problems with salt and water metabolism.  HOME CARE INSTRUCTIONS   Constipation is usually best cared for without medicines. Increasing dietary fiber and eating more fruits and vegetables is the best way to manage constipation.   Slowly increase fiber intake to 25 to 38 grams per day. Whole grains, fruits, vegetables, and legumes are good sources of fiber. A dietitian can further help you incorporate high-fiber foods into your diet.   Drink enough water and fluids to keep your urine clear or pale yellow.   A fiber supplement may be added to your diet if you cannot get enough fiber from foods.   Increasing your activities also helps improve regularity.   Suppositories, as suggested by your caregiver, will also help. If you are using antacids, such as aluminum or calcium containing products, it will be  helpful to switch to products containing magnesium if your caregiver says it is okay.   If you have been given a liquid injection (enema) today, this is only a temporary measure. It should not be relied on for treatment of longstanding (chronic) constipation.   Stronger measures, such as magnesium sulfate, should be avoided if possible. This may cause uncontrollable diarrhea. Using magnesium sulfate may not allow you time to make it to the bathroom.  SEEK IMMEDIATE MEDICAL CARE IF:   There is bright red blood in the stool.   The constipation stays for more than 4 days.   There is belly (abdominal) or rectal pain.   You do not seem to be getting better.   You have any questions or concerns.  MAKE SURE YOU:   Understand these instructions.   Will watch your condition.   Will get help right away if you are not doing well or get worse.  Document Released: 02/16/2004 Document Revised: 05/09/2011 Document Reviewed: 04/23/2011 [facility] Patient Information 2012 [place], [place].

## 2011-10-23 NOTE — ED Provider Notes (Addendum)
History     CSN: [id number]  Arrival date & time 10/23/11  1116   First MD Initiated Contact with Patient 10/23/11 1248      Chief Complaint  Patient presents with  . Puncture Wound    (Consider location/radiation/quality/duration/timing/severity/associated sxs/prior treatment) HPI Comments: Patient presents with bleeding from a right medial ankle wound.  There is no reports of any falls or injury.  The wife gives most of the history due to dementia and the patient.  She notes that she was putting lotion on his feet and ankles due to dry skin and noticed a wound there that she thought looked like it was about to past.  Sometime today it started bleeding with noted significant bleeding at home.  The wife states that she believes he lost at least a pint of blood.  Patient otherwise denies any pain at this time.  He has no focal weakness or numbness in his foot or ankle.  He is on aspirin but no other anticoagulation agents.  The history is provided by the patient and the spouse. No language interpreter was used.    Past Medical History  Diagnosis Date  . CAROTID ARTERY DISEASE 08/20/2009    doppler 2/10:  0-39% bilat ICA  / Doppler, September 21, 2010, stable, 0-39% bilateral  . Essential hypertension, benign 08/22/2008  . SINUS BRADYCARDIA 08/22/2008  . Edema 08/21/2009    Started January, 2011, probable mild volume overload and venous insufficiency  . Aortic stenosis     Mild, echo, March, 2011 / mild, echo, April, 2012  . Aortic insufficiency     Moderate, echo, March, 2011 / Moderate, echo, April, 2012  . Atherosclerosis     aorta; duplex 1/12: stable, no dissection, mid to distal irregular plaque with ulcerative appearance similar to prior study; severe stenosis IMA  . Aortic root dilation     Mild dilatation thoracic aorta  . Hemoptysis     Significant outpatient episode February 2 011, related to pneumonia by her  . Ejection fraction     60% echo March, 2011 / EF 60-65%,, September 21, 2010, echo  . Pneumonia     Right middle lobe, February, 2011 resolved  . Pre-syncope   . Varicose veins     Patient had a procedure related to the veins in his legs, 2012  . TIA (transient ischemic attack)     Possible TIA July, 2012, /  episode of staring April 04, 2011    History reviewed. No pertinent past surgical history.  History reviewed. No pertinent family history.  History  Substance Use Topics  . Smoking status: Former Smoker    Quit date: 06/03/1970  . Smokeless tobacco: Not on file  . Alcohol Use: No      Review of Systems  Constitutional: Negative.  Negative for fever and chills.  HENT: Negative.   Eyes: Negative.  Negative for discharge and redness.  Respiratory: Negative.  Negative for cough and shortness of breath.   Cardiovascular: Negative.  Negative for chest pain.  Gastrointestinal: Negative.  Negative for nausea, vomiting and abdominal pain.  Genitourinary: Negative.  Negative for hematuria.  Musculoskeletal: Negative.  Negative for back pain.  Skin: Positive for wound. Negative for color change and rash.  Neurological: Negative for syncope and headaches.  Hematological: Negative for adenopathy.  Psychiatric/Behavioral: Negative for confusion.  All other systems reviewed and are negative.    Allergies  Review of patient's allergies indicates no known allergies.  Home Medications  Current Outpatient Rx  Name Route Sig Dispense Refill  . ALPRAZOLAM 0.5 MG PO TABS Oral Take 0.5 mg by mouth as needed.      Marland Kitchen AMLODIPINE BESYLATE 5 MG PO TABS Oral Take 2 tablets (10 mg total) by mouth daily. 60 tablet 5    90 day supply is acceptable  . ASPIRIN 81 MG PO TABS Oral Take 81 mg by mouth daily.      Marland Kitchen DOCUSATE SODIUM 100 MG PO CAPS Oral Take 100 mg by mouth as needed.      . DONEPEZIL HCL 5 MG PO TABS Oral Take 5 mg by mouth at bedtime.    . FUROSEMIDE 40 MG PO TABS  20 mg. 1 tab M, W, F    . HYDROCHLOROTHIAZIDE 25 MG PO TABS Oral Take 25 mg  by mouth daily.      . MELOXICAM 7.5 MG PO TABS Oral Take 7.5 mg by mouth daily as needed.    . QUINAPRIL HCL 40 MG PO TABS  1 ONCE DAILY 90 tablet 2    BP 109/48  Pulse 73  Temp(Src) 97.3 F (36.3 C) (Oral)  Resp 14  SpO2 100%  Physical Exam  Nursing note and vitals reviewed. Constitutional: He appears well-developed and well-nourished.  Non-toxic appearance. He does not have a sickly appearance.  HENT:  Head: Normocephalic and atraumatic.  Eyes: Conjunctivae, EOM and lids are normal. Pupils are equal, round, and reactive to light.  Neck: Trachea normal, normal range of motion and full passive range of motion without pain. Neck supple.  Cardiovascular: Normal rate, regular rhythm and normal heart sounds.   Pulmonary/Chest: Effort normal and breath sounds normal. No respiratory distress.  Abdominal: Soft. Normal appearance. He exhibits no distension. There is no tenderness. There is no rebound and no CVA tenderness.  Musculoskeletal: Normal range of motion.       Right medial heel has a 3-4 mm wound site.  No acute bleeding at this time.  Patient is a palpable DP pulse.  Capillary refill in his toes is less than 2 seconds.  Patient can move all his toes.  He notes a sensation of light touch in his medial and lateral aspect of his foot.  He can move his ankle up and down without difficulty.  He does have lower leg edema bilaterally that is symmetric.  Neurological: He is alert. He has normal strength.  Skin: Skin is warm, dry and intact. No rash noted.       Skin is dry across the patient's lower legs and feet the  Psychiatric: His behavior is normal.    ED Course  Procedures (including critical care time)  Results for orders placed during the hospital encounter of 10/23/11  CBC      Component Value Range   WBC 11.8 (*) 4.0 - 10.5 (K/uL)   RBC 3.42 (*) 4.22 - 5.81 (MIL/uL)   Hemoglobin 10.9 (*) 13.0 - 17.0 (g/dL)   HCT 40.9 (*) 81.1 - 52.0 (%)   MCV 94.2  78.0 - 100.0 (fL)    MCH 31.9  26.0 - 34.0 (pg)   MCHC 33.9  30.0 - 36.0 (g/dL)   RDW 91.4  78.2 - 95.6 (%)   Platelets 313  150 - 400 (K/uL)  DIFFERENTIAL      Component Value Range   Neutrophils Relative 84 (*) 43 - 77 (%)   Neutro Abs 9.9 (*) 1.7 - 7.7 (K/uL)   Lymphocytes Relative 5 (*) 12 - 46 (%)  Lymphs Abs 0.6 (*) 0.7 - 4.0 (K/uL)   Monocytes Relative 10  3 - 12 (%)   Monocytes Absolute 1.2 (*) 0.1 - 1.0 (K/uL)   Eosinophils Relative 1  0 - 5 (%)   Eosinophils Absolute 0.1  0.0 - 0.7 (K/uL)   Basophils Relative 0  0 - 1 (%)   Basophils Absolute 0.0  0.0 - 0.1 (K/uL)  BASIC METABOLIC PANEL      Component Value Range   Sodium 134 (*) 135 - 145 (mEq/L)   Potassium 3.8  3.5 - 5.1 (mEq/L)   Chloride 98  96 - 112 (mEq/L)   CO2 27  19 - 32 (mEq/L)   Glucose, Bld 124 (*) 70 - 99 (mg/dL)   BUN 15  6 - 23 (mg/dL)   Creatinine, Ser 0.86  0.50 - 1.35 (mg/dL)   Calcium 9.7  8.4 - 57.8 (mg/dL)   GFR calc non Af Amer 77 (*) >90 (mL/min)   GFR calc Af Amer 90 (*) >90 (mL/min)  APTT      Component Value Range   aPTT 45 (*) 24 - 37 (seconds)  PROTIME-INR      Component Value Range   Prothrombin Time 16.0 (*) 11.6 - 15.2 (seconds)   INR 1.25  0.00 - 1.49       MDM  Patient with only a small wound site as source of bleeding.  Bleeding is currently controlled.  Given report of significant bleeding at home I will check a CBC for further evaluation of the patient's blood counts.    Nat Christen, MD 10/23/11 1300  Patient reassessed at 2:30 PM and he still has no further bleeding.  Patient's hemoglobin is stable.  I believe patient is safe for discharge home.  The wife secondarily notes the patient has problems with constipation and is on docusate one time per day.  I've instructed her that she could increase this to twice a day and add MiraLAX if needed to assist with his constipation symptoms.  Nat Christen, MD 10/23/11 1440

## 2012-03-13 ENCOUNTER — Ambulatory Visit (INDEPENDENT_AMBULATORY_CARE_PROVIDER_SITE_OTHER): Payer: Medicare Other | Admitting: Family Medicine

## 2012-03-13 VITALS — BP 149/60 | HR 81 | Temp 97.3°F | Resp 20 | Ht 64.5 in | Wt 136.0 lb

## 2012-03-13 DIAGNOSIS — R21 Rash and other nonspecific skin eruption: Secondary | ICD-10-CM

## 2012-03-13 DIAGNOSIS — L0291 Cutaneous abscess, unspecified: Secondary | ICD-10-CM

## 2012-03-13 DIAGNOSIS — D72829 Elevated white blood cell count, unspecified: Secondary | ICD-10-CM

## 2012-03-13 DIAGNOSIS — L0390 Cellulitis, unspecified: Secondary | ICD-10-CM

## 2012-03-13 LAB — POCT CBC
Granulocyte percent: 68.1 %G (ref 37–80)
HCT, POC: 37.1 % — AB (ref 43.5–53.7)
Hemoglobin: 11.5 g/dL — AB (ref 14.1–18.1)
Lymph, poc: 2.7 (ref 0.6–3.4)
MCH, POC: 25.8 pg — AB (ref 27–31.2)
MCHC: 31 g/dL — AB (ref 31.8–35.4)
MCV: 83.3 fL (ref 80–97)
MID (cbc): 1.6 — AB (ref 0–0.9)
MPV: 8.5 fL (ref 0–99.8)
POC Granulocyte: 9.1 — AB (ref 2–6.9)
POC LYMPH PERCENT: 19.8 %L (ref 10–50)
POC MID %: 12.1 %M — AB (ref 0–12)
Platelet Count, POC: 488 10*3/uL — AB (ref 142–424)
RBC: 4.45 M/uL — AB (ref 4.69–6.13)
RDW, POC: 20.6 %
WBC: 13.4 10*3/uL — AB (ref 4.6–10.2)

## 2012-03-13 LAB — POCT URINALYSIS DIPSTICK
Bilirubin, UA: NEGATIVE
Blood, UA: NEGATIVE
Glucose, UA: NEGATIVE
Leukocytes, UA: NEGATIVE
Nitrite, UA: NEGATIVE
Protein, UA: NEGATIVE
Spec Grav, UA: 1.025
Urobilinogen, UA: 0.2
pH, UA: 5.5

## 2012-03-13 LAB — POCT UA - MICROSCOPIC ONLY
Bacteria, U Microscopic: NEGATIVE
Casts, Ur, LPF, POC: NEGATIVE
Crystals, Ur, HPF, POC: NEGATIVE
Epithelial cells, urine per micros: NEGATIVE
Mucus, UA: NEGATIVE
RBC, urine, microscopic: NEGATIVE
WBC, Ur, HPF, POC: NEGATIVE
Yeast, UA: NEGATIVE

## 2012-03-13 MED ORDER — CEPHALEXIN 500 MG PO CAPS: 500.0000 mg | ORAL_CAPSULE | Freq: Four times a day (QID) | ORAL | Status: DC

## 2012-03-13 MED ORDER — TRIAMCINOLONE ACETONIDE 0.1 % EX CREA: TOPICAL_CREAM | Freq: Two times a day (BID) | CUTANEOUS | Status: DC

## 2012-03-13 MED ORDER — METHYLPREDNISOLONE 4 MG PO KIT: PACK | ORAL | Status: DC

## 2012-03-13 NOTE — Progress Notes (Signed)
[facility]:  Office Visit  Chief Complaint:  Chief Complaint  Patient presents with  . Rash    all over body    HPI: Benjamin Mueller is a 78 y.o. male who complains of  Acute on chronic rash that he has had several years. The  has gotten worse. It itches. He has blistering and open areas behind both his knees, left worse than right per wife. The patient has dementia is unable to tell me when this flareup started and if he had tried anything different in terms of medicines, detergents.soaps, clothes, food, travels, etc. 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 CAROTID ARTERY DISEASE 08/20/2009    doppler 2/10:  0-39% bilat ICA  / Doppler, September 21, 2010, stable, 0-39% bilateral  . Essential hypertension, benign 08/22/2008  . SINUS BRADYCARDIA 08/22/2008  . Edema 08/21/2009    Started January, 2011, probable mild volume overload and venous insufficiency  . Aortic stenosis     Mild, echo, March, 2011 / mild, echo, April, 2012  . Aortic insufficiency     Moderate, echo, March, 2011 / Moderate, echo, April, 2012  . Atherosclerosis     aorta; duplex 1/12: stable, no dissection, mid to distal irregular plaque with ulcerative appearance similar to prior study; severe stenosis IMA  . Aortic root dilation     Mild dilatation thoracic aorta  . Hemoptysis     Significant outpatient episode February 2 011, related to pneumonia by her  . Ejection fraction     60% echo March, 2011 / EF 60-65%,, September 21, 2010, echo  . Pneumonia     Right middle lobe, February, 2011 resolved  . Pre-syncope   . Varicose veins     Patient had a procedure related to the veins in his legs, 2012  . TIA (transient ischemic attack)     Possible TIA July, 2012, /  episode of staring April 04, 2011   No past surgical history on file. History   Social History  . Marital Status: Married    Spouse Name: N/A    Number of Children: N/A  . Years of  Education: N/A   Social History Main Topics  . Smoking status: Former Smoker    Quit date: 06/03/1970  . Smokeless tobacco: Not on file  . Alcohol Use: No  . Drug Use: No  . Sexually Active: Not on file   Other Topics Concern  . Not on file   Social History Narrative  . No narrative on file   No family history on file. No Known Allergies Prior to Admission medications   Medication Sig Start Date End Date Taking? Authorizing Provider  ALPRAZolam Prudy Feeler) 0.5 MG tablet Take 0.5 mg by mouth as needed.     Yes Historical Provider, MD  aspirin 81 MG tablet Take 81 mg by mouth daily.     Yes Historical Provider, MD  donepezil (ARICEPT) 5 MG tablet Take 5 mg by mouth at bedtime.   Yes Historical Provider, MD  furosemide (LASIX) 40 MG tablet Take 20 mg by mouth See admin instructions. 1 tab M, W, F   Yes Historical Provider, MD  hydrochlorothiazide 25 MG tablet Take 25 mg by mouth daily.     Yes Historical Provider, MD  metolazone (ZAROXOLYN) 2.5 MG tablet Take 2.5 mg by mouth daily.   Yes Historical Provider, MD  quinapril (ACCUPRIL) 40 MG tablet 1 ONCE DAILY 05/06/11  Yes Luis Abed, MD  amLODipine (NORVASC) 5 MG tablet Take 5 mg by mouth daily. 09/25/11   Luis Abed, MD  docusate sodium (COLACE) 100 MG capsule Take 100 mg by mouth as needed.      Historical Provider, MD  HYDROcodone-acetaminophen (NORCO) 5-325 MG per tablet Take 1 tablet by mouth every 6 (six) hours as needed. pain    Historical Provider, MD  meloxicam (MOBIC) 7.5 MG tablet Take 7.5 mg by mouth daily as needed.    Historical Provider, MD     ROS: The patient denies fevers, chills, night sweats, unintentional weight loss, chest pain, palpitations, wheezing, dyspnea on exertion, nausea, vomiting, abdominal pain, dysuria, hematuria, melena. ( unreliable, patient is demented)  All other systems have been reviewed and were otherwise negative with the exception of those mentioned in the HPI and as above.    PHYSICAL  EXAM: Filed Vitals:   03/13/12 1436  BP: 149/60  Pulse: 81  Temp: 97.3 F (36.3 C)  Resp: 20   Filed Vitals:   03/13/12 1436  Height: 5' 4.5" (1.638 m)  Weight: 136 lb (61.689 kg)   Body mass index is 22.98 kg/(m^2).  General: Alert, no acute distress HEENT:  Normocephalic, atraumatic, oropharynx patent.  Cardiovascular:  Regular rate and rhythm, no rubs murmurs or gallops.  No Carotid bruits, radial pulse intact. No pedal edema.  Respiratory: Clear to auscultation bilaterally.  No wheezes, rales, or rhonchi.  No cyanosis, no use of accessory musculature GI: No organomegaly, abdomen is soft and non-tender, positive bowel sounds.  No masses. Skin: + red  Excoriated erythematous rash, diffuse, more excoriations and open areas pesterior knee bilaterally. Minimal warmth right greater than left in bilateral legs. No asymmetric swelling. Neurologic: Facial musculature symmetric. Psychiatric: Patient is appropriate throughout our interaction. Lymphatic: No cervical lymphadenopathy Musculoskeletal: Gait intact.   LABS: Results for orders placed in visit on 03/13/12  POCT CBC      Component Value Range   WBC 13.4 (*) 4.6 - 10.2 K/uL   Lymph, poc 2.7  0.6 - 3.4   POC LYMPH PERCENT 19.8  10 - 50 %L   MID (cbc) 1.6 (*) 0 - 0.9   POC MID % 12.1 (*) 0 - 12 %M   POC Granulocyte 9.1 (*) 2 - 6.9   Granulocyte percent 68.1  37 - 80 %G   RBC 4.45 (*) 4.69 - 6.13 M/uL   Hemoglobin 11.5 (*) 14.1 - 18.1 g/dL   HCT, POC 16.1 (*) 09.6 - 53.7 %   MCV 83.3  80 - 97 fL   MCH, POC 25.8 (*) 27 - 31.2 pg   MCHC 31.0 (*) 31.8 - 35.4 g/dL   RDW, POC 04.5     Platelet Count, POC 488 (*) 142 - 424 K/uL   MPV 8.5  0 - 99.8 fL  POCT UA - MICROSCOPIC ONLY      Component Value Range   WBC, Ur, HPF, POC neg     RBC, urine, microscopic neg     Bacteria, U Microscopic neg     Mucus, UA neg     Epithelial cells, urine per micros neg     Crystals, Ur, HPF, POC neg     Casts, Ur, LPF, POC neg     Yeast,  UA neg    POCT URINALYSIS DIPSTICK      Component Value Range   Color, UA yellow     Clarity, UA clear  Glucose, UA neg     Bilirubin, UA neg     Ketones, UA trace     Spec Grav, UA 1.025     Blood, UA neg     pH, UA 5.5     Protein, UA neg     Urobilinogen, UA 0.2     Nitrite, UA neg     Leukocytes, UA Negative       EKG/XRAY:   Primary read interpreted by Dr. Conley Rolls at [facility].   ASSESSMENT/PLAN: Encounter Diagnoses  Name Primary?  . Rash and nonspecific skin eruption Yes  . Leukocytosis   . Cellulitis    Will aggressively treat since I cannot illicit history very well either from patietn due to dementia or his wife. I called the step son, Trey Paula , and he was not able to give me much info either  Rx Keflex for possible secondary cellulitis due to scratching of rash, Medrol dose pack  zyrtec, and triamcinolon cream for itching and diffuse infalmmation  Labs including urine cx pending  F/u in 48 hrs     Ademide Schaberg PHUONG, DO 03/13/2012 4:27 PM

## 2012-03-14 LAB — COMPREHENSIVE METABOLIC PANEL
Alkaline Phosphatase: 159 U/L — ABNORMAL HIGH (ref 39–117)
BUN: 36 mg/dL — ABNORMAL HIGH (ref 6–23)
CO2: 31 mEq/L (ref 19–32)
Creat: 1.66 mg/dL — ABNORMAL HIGH (ref 0.50–1.35)
Glucose, Bld: 91 mg/dL (ref 70–99)
Sodium: 139 mEq/L (ref 135–145)
Total Bilirubin: 0.3 mg/dL (ref 0.3–1.2)

## 2012-03-14 LAB — COMPREHENSIVE METABOLIC PANEL WITH GFR
ALT: 20 U/L (ref 0–53)
AST: 27 U/L (ref 0–37)
Albumin: 3.9 g/dL (ref 3.5–5.2)
Calcium: 10 mg/dL (ref 8.4–10.5)
Chloride: 99 meq/L (ref 96–112)
Potassium: 5.5 meq/L — ABNORMAL HIGH (ref 3.5–5.3)
Total Protein: 7.5 g/dL (ref 6.0–8.3)

## 2012-03-15 ENCOUNTER — Ambulatory Visit (INDEPENDENT_AMBULATORY_CARE_PROVIDER_SITE_OTHER): Payer: Medicare Other | Admitting: Family Medicine

## 2012-03-15 VITALS — BP 120/50 | HR 63 | Temp 98.1°F | Resp 16 | Ht 64.5 in | Wt 133.0 lb

## 2012-03-15 DIAGNOSIS — L0291 Cutaneous abscess, unspecified: Secondary | ICD-10-CM

## 2012-03-15 DIAGNOSIS — R899 Unspecified abnormal finding in specimens from other organs, systems and tissues: Secondary | ICD-10-CM

## 2012-03-15 DIAGNOSIS — R21 Rash and other nonspecific skin eruption: Secondary | ICD-10-CM

## 2012-03-15 DIAGNOSIS — N289 Disorder of kidney and ureter, unspecified: Secondary | ICD-10-CM

## 2012-03-15 DIAGNOSIS — R6889 Other general symptoms and signs: Secondary | ICD-10-CM

## 2012-03-15 DIAGNOSIS — L0390 Cellulitis, unspecified: Secondary | ICD-10-CM

## 2012-03-15 LAB — URINE CULTURE: Colony Count: 30000

## 2012-03-15 LAB — POCT CBC
Granulocyte percent: 78.2 % (ref 37–80)
HCT, POC: 31.6 % — AB (ref 43.5–53.7)
Hemoglobin: 9.5 g/dL — AB (ref 14.1–18.1)
Lymph, poc: 1.3 (ref 0.6–3.4)
MCH, POC: 24.9 pg — AB (ref 27–31.2)
MCHC: 30.1 g/dL — AB (ref 31.8–35.4)
MCV: 82.7 fL (ref 80–97)
MID (cbc): 0.5 (ref 0–0.9)
MPV: 8.6 fL (ref 0–99.8)
POC Granulocyte: 6.3 (ref 2–6.9)
POC LYMPH PERCENT: 15.8 %L (ref 10–50)
POC MID %: 6 %M (ref 0–12)
Platelet Count, POC: 358 10*3/uL (ref 142–424)
RBC: 3.82 M/uL — AB (ref 4.69–6.13)
RDW, POC: 20.1 %
WBC: 8 10*3/uL (ref 4.6–10.2)

## 2012-03-15 LAB — COMPREHENSIVE METABOLIC PANEL WITH GFR
AST: 24 U/L (ref 0–37)
Albumin: 3.8 g/dL (ref 3.5–5.2)
BUN: 23 mg/dL (ref 6–23)
Calcium: 10 mg/dL (ref 8.4–10.5)
Chloride: 99 meq/L (ref 96–112)
Glucose, Bld: 102 mg/dL — ABNORMAL HIGH (ref 70–99)
Potassium: 4.4 meq/L (ref 3.5–5.3)
Total Protein: 6.9 g/dL (ref 6.0–8.3)

## 2012-03-15 LAB — COMPREHENSIVE METABOLIC PANEL
ALT: 20 U/L (ref 0–53)
Alkaline Phosphatase: 127 U/L — ABNORMAL HIGH (ref 39–117)
CO2: 31 mEq/L (ref 19–32)
Creat: 0.82 mg/dL (ref 0.50–1.35)
Sodium: 135 mEq/L (ref 135–145)
Total Bilirubin: 0.4 mg/dL (ref 0.3–1.2)

## 2012-03-15 NOTE — Progress Notes (Signed)
[facility]:  Office Visit  Chief Complaint:  Chief Complaint  Patient presents with  . Follow-up    back    HPI: Benjamin Mueller is a 78 y.o. male who complains of : Recheck for rash and also cellulitis. Doing better. Less itchy, less inflammed appearing. Less warmth.   Past Medical History  Diagnosis Date  . CAROTID ARTERY DISEASE 08/20/2009    doppler 2/10:  0-39% bilat ICA  / Doppler, September 21, 2010, stable, 0-39% bilateral  . Essential hypertension, benign 08/22/2008  . SINUS BRADYCARDIA 08/22/2008  . Edema 08/21/2009    Started January, 2011, probable mild volume overload and venous insufficiency  . Aortic stenosis     Mild, echo, March, 2011 / mild, echo, April, 2012  . Aortic insufficiency     Moderate, echo, March, 2011 / Moderate, echo, April, 2012  . Atherosclerosis     aorta; duplex 1/12: stable, no dissection, mid to distal irregular plaque with ulcerative appearance similar to prior study; severe stenosis IMA  . Aortic root dilation     Mild dilatation thoracic aorta  . Hemoptysis     Significant outpatient episode February 2 011, related to pneumonia by her  . Ejection fraction     60% echo March, 2011 / EF 60-65%,, September 21, 2010, echo  . Pneumonia     Right middle lobe, February, 2011 resolved  . Pre-syncope   . Varicose veins     Patient had a procedure related to the veins in his legs, 2012  . TIA (transient ischemic attack)     Possible TIA July, 2012, /  episode of staring April 04, 2011   No past surgical history on file. History   Social History  . Marital Status: Married    Spouse Name: N/A    Number of Children: N/A  . Years of Education: N/A   Social History Main Topics  . Smoking status: Former Smoker    Quit date: 06/03/1970  . Smokeless tobacco: None  . Alcohol Use: No  . Drug Use: No  . Sexually Active: None   Other Topics Concern  . None   Social History Narrative  . None   No family history on  file. No Known Allergies Prior to Admission medications   Medication Sig Start Date End Date Taking? Authorizing Provider  ALPRAZolam Prudy Feeler) 0.5 MG tablet Take 0.5 mg by mouth as needed.     Yes Historical Provider, MD  amLODipine (NORVASC) 5 MG tablet Take 5 mg by mouth daily. 09/25/11  Yes Luis Abed, MD  aspirin 81 MG tablet Take 81 mg by mouth daily.     Yes Historical Provider, MD  cephALEXin (KEFLEX) 500 MG capsule Take 1 capsule (500 mg total) by mouth 4 (four) times daily. 03/13/12  Yes Zavannah Deblois P Mable Lashley, DO  docusate sodium (COLACE) 100 MG capsule Take 100 mg by mouth as needed.     Yes Historical Provider, MD  donepezil (ARICEPT) 5 MG tablet Take 5 mg by mouth at bedtime.   Yes Historical Provider, MD  furosemide (LASIX) 40 MG tablet Take 20 mg by mouth See admin instructions. 1 tab M, W, F   Yes Historical Provider, MD  hydrochlorothiazide 25 MG tablet Take 25 mg by mouth daily.     Yes Historical Provider, MD  HYDROcodone-acetaminophen (NORCO) 5-325 MG per tablet Take 1 tablet by mouth every 6 (six) hours as needed. pain   Yes Historical Provider, MD  meloxicam (  MOBIC) 7.5 MG tablet Take 7.5 mg by mouth daily as needed.   Yes Historical Provider, MD  methylPREDNISolone (MEDROL, PAK,) 4 MG tablet follow package directions 03/13/12  Yes Milee Qualls P Shaylinn Hladik, DO  metolazone (ZAROXOLYN) 2.5 MG tablet Take 2.5 mg by mouth daily.   Yes Historical Provider, MD  quinapril (ACCUPRIL) 40 MG tablet 1 ONCE DAILY 05/06/11  Yes Luis Abed, MD  triamcinolone cream (KENALOG) 0.1 % Apply topically 2 (two) times daily. Do not apply on face or genital area. Use as needed for itching 03/13/12  Yes Merville Hijazi P Tiwanna Tuch, DO     ROS: The patient denies fevers, chills, night sweats, unintentional weight loss, chest pain, palpitations, wheezing, dyspnea on exertion, nausea, vomiting, abdominal pain, dysuria, hematuria, melena, numbness, weakness, or tingling.   All other systems have been reviewed and were otherwise negative  with the exception of those mentioned in the HPI and as above.    PHYSICAL EXAM: Filed Vitals:   03/15/12 0857  BP: 130/42  Pulse: 63  Temp: 98.1 F (36.7 C)  Resp: 16   Filed Vitals:   03/15/12 0857  Height: 5' 4.5" (1.638 m)  Weight: 133 lb (60.328 kg)   Body mass index is 22.48 kg/(m^2).  General: Alert, no acute distress HEENT:  Normocephalic, atraumatic, oropharynx patent.  Cardiovascular:  Regular rate and rhythm, no rubs murmurs or gallops.  No Carotid bruits, radial pulse intact. No pedal edema.  Respiratory: Clear to auscultation bilaterally.  No wheezes, rales, or rhonchi.  No cyanosis, no use of accessory musculature GI: No organomegaly, abdomen is soft and non-tender, positive bowel sounds.  No masses. Skin: + rash and cellulitis. Neurologic: Facial musculature symmetric. Psychiatric: Patient is appropriate throughout our interaction. Lymphatic: No cervical lymphadenopathy Musculoskeletal: Gait intact.   LABS: Results for orders placed in visit on 03/13/12  POCT CBC      Component Value Range   WBC 13.4 (*) 4.6 - 10.2 K/uL   Lymph, poc 2.7  0.6 - 3.4   POC LYMPH PERCENT 19.8  10 - 50 %L   MID (cbc) 1.6 (*) 0 - 0.9   POC MID % 12.1 (*) 0 - 12 %M   POC Granulocyte 9.1 (*) 2 - 6.9   Granulocyte percent 68.1  37 - 80 %G   RBC 4.45 (*) 4.69 - 6.13 M/uL   Hemoglobin 11.5 (*) 14.1 - 18.1 g/dL   HCT, POC 40.9 (*) 81.1 - 53.7 %   MCV 83.3  80 - 97 fL   MCH, POC 25.8 (*) 27 - 31.2 pg   MCHC 31.0 (*) 31.8 - 35.4 g/dL   RDW, POC 91.4     Platelet Count, POC 488 (*) 142 - 424 K/uL   MPV 8.5  0 - 99.8 fL  POCT UA - MICROSCOPIC ONLY      Component Value Range   WBC, Ur, HPF, POC neg     RBC, urine, microscopic neg     Bacteria, U Microscopic neg     Mucus, UA neg     Epithelial cells, urine per micros neg     Crystals, Ur, HPF, POC neg     Casts, Ur, LPF, POC neg     Yeast, UA neg    POCT URINALYSIS DIPSTICK      Component Value Range   Color, UA yellow      Clarity, UA clear     Glucose, UA neg     Bilirubin, UA neg  Ketones, UA trace     Spec Grav, UA 1.025     Blood, UA neg     pH, UA 5.5     Protein, UA neg     Urobilinogen, UA 0.2     Nitrite, UA neg     Leukocytes, UA Negative    COMPREHENSIVE METABOLIC PANEL      Component Value Range   Sodium 139  135 - 145 mEq/L   Potassium 5.5 (*) 3.5 - 5.3 mEq/L   Chloride 99  96 - 112 mEq/L   CO2 31  19 - 32 mEq/L   Glucose, Bld 91  70 - 99 mg/dL   BUN 36 (*) 6 - 23 mg/dL   Creat 2.95 (*) 6.21 - 1.35 mg/dL   Total Bilirubin 0.3  0.3 - 1.2 mg/dL   Alkaline Phosphatase 159 (*) 39 - 117 U/L   AST 27  0 - 37 U/L   ALT 20  0 - 53 U/L   Total Protein 7.5  6.0 - 8.3 g/dL   Albumin 3.9  3.5 - 5.2 g/dL   Calcium 30.8  8.4 - 65.7 mg/dL     EKG/XRAY:   Primary read interpreted by Dr. Conley Rolls at [facility].   ASSESSMENT/PLAN: Encounter Diagnoses  Name Primary?  . Cellulitis Yes  . Edema   . Abnormal laboratory test result    Recheck labs Spoke with PCP Will send labs to him via fax [phone] Dr. Sullivan Lone phone [phone] Fax Please fax new repeat labs to him  Chi Garlow PHUONG, DO 03/15/2012 9:46 AM

## 2012-06-04 ENCOUNTER — Other Ambulatory Visit: Payer: Self-pay

## 2012-06-04 MED ORDER — QUINAPRIL HCL 40 MG PO TABS: 40.0000 mg | ORAL_TABLET | Freq: Every day | ORAL | Status: DC

## 2012-06-24 ENCOUNTER — Other Ambulatory Visit: Payer: Self-pay

## 2012-06-24 MED ORDER — AMLODIPINE BESYLATE 5 MG PO TABS: 5.0000 mg | ORAL_TABLET | Freq: Every day | ORAL | Status: DC

## 2012-08-14 ENCOUNTER — Emergency Department (HOSPITAL_COMMUNITY): Payer: Medicare Other

## 2012-08-14 ENCOUNTER — Emergency Department (HOSPITAL_COMMUNITY)
Admission: EM | Admit: 2012-08-14 | Discharge: 2012-08-14 | Disposition: A | Discharge status: Home / Self Care | Payer: Medicare Other | Attending: Emergency Medicine | Admitting: Emergency Medicine

## 2012-08-14 ENCOUNTER — Encounter (HOSPITAL_COMMUNITY): Payer: Self-pay | Admitting: Emergency Medicine

## 2012-08-14 DIAGNOSIS — Y9301 Activity, walking, marching and hiking: Secondary | ICD-10-CM | POA: Insufficient documentation

## 2012-08-14 DIAGNOSIS — S99929A Unspecified injury of unspecified foot, initial encounter: Secondary | ICD-10-CM | POA: Insufficient documentation

## 2012-08-14 DIAGNOSIS — Z8701 Personal history of pneumonia (recurrent): Secondary | ICD-10-CM | POA: Insufficient documentation

## 2012-08-14 DIAGNOSIS — R296 Repeated falls: Secondary | ICD-10-CM | POA: Insufficient documentation

## 2012-08-14 DIAGNOSIS — F0390 Unspecified dementia without behavioral disturbance: Secondary | ICD-10-CM | POA: Insufficient documentation

## 2012-08-14 DIAGNOSIS — G253 Myoclonus: Secondary | ICD-10-CM

## 2012-08-14 DIAGNOSIS — Z79899 Other long term (current) drug therapy: Secondary | ICD-10-CM | POA: Insufficient documentation

## 2012-08-14 DIAGNOSIS — R066 Hiccough: Secondary | ICD-10-CM | POA: Insufficient documentation

## 2012-08-14 DIAGNOSIS — Z8673 Personal history of transient ischemic attack (TIA), and cerebral infarction without residual deficits: Secondary | ICD-10-CM | POA: Insufficient documentation

## 2012-08-14 DIAGNOSIS — S46909A Unspecified injury of unspecified muscle, fascia and tendon at shoulder and upper arm level, unspecified arm, initial encounter: Secondary | ICD-10-CM | POA: Insufficient documentation

## 2012-08-14 DIAGNOSIS — M62838 Other muscle spasm: Secondary | ICD-10-CM | POA: Insufficient documentation

## 2012-08-14 DIAGNOSIS — I10 Essential (primary) hypertension: Secondary | ICD-10-CM | POA: Insufficient documentation

## 2012-08-14 DIAGNOSIS — Z87891 Personal history of nicotine dependence: Secondary | ICD-10-CM | POA: Insufficient documentation

## 2012-08-14 DIAGNOSIS — S8990XA Unspecified injury of unspecified lower leg, initial encounter: Secondary | ICD-10-CM | POA: Insufficient documentation

## 2012-08-14 DIAGNOSIS — W1809XA Striking against other object with subsequent fall, initial encounter: Secondary | ICD-10-CM | POA: Insufficient documentation

## 2012-08-14 DIAGNOSIS — S0990XA Unspecified injury of head, initial encounter: Secondary | ICD-10-CM | POA: Insufficient documentation

## 2012-08-14 DIAGNOSIS — Z8679 Personal history of other diseases of the circulatory system: Secondary | ICD-10-CM | POA: Insufficient documentation

## 2012-08-14 DIAGNOSIS — S4980XA Other specified injuries of shoulder and upper arm, unspecified arm, initial encounter: Secondary | ICD-10-CM | POA: Insufficient documentation

## 2012-08-14 DIAGNOSIS — Z7982 Long term (current) use of aspirin: Secondary | ICD-10-CM | POA: Insufficient documentation

## 2012-08-14 DIAGNOSIS — Y929 Unspecified place or not applicable: Secondary | ICD-10-CM | POA: Insufficient documentation

## 2012-08-14 LAB — CBC
MCH: 28.7 pg (ref 26.0–34.0)
MCHC: 33.4 g/dL (ref 30.0–36.0)
Platelets: 283 10*3/uL (ref 150–400)

## 2012-08-14 LAB — COMPREHENSIVE METABOLIC PANEL
ALT: 18 U/L (ref 0–53)
Alkaline Phosphatase: 84 U/L (ref 39–117)
BUN: 21 mg/dL (ref 6–23)
CO2: 30 mEq/L (ref 19–32)
Calcium: 9.8 mg/dL (ref 8.4–10.5)
GFR calc Af Amer: 88 mL/min — ABNORMAL LOW (ref >90)
GFR calc non Af Amer: 76 mL/min — ABNORMAL LOW (ref >90)
Glucose, Bld: 113 mg/dL — ABNORMAL HIGH (ref 70–99)
Sodium: 136 mEq/L (ref 135–145)

## 2012-08-14 LAB — MAGNESIUM: Magnesium: 2.1 mg/dL (ref 1.5–2.5)

## 2012-08-14 NOTE — ED Provider Notes (Signed)
History     CSN: [id number]  Arrival date & time 08/14/12  5621   First MD Initiated Contact with Patient 08/14/12 0827      Chief Complaint  Patient presents with  . Fall   Patient is a 79 y.o. male presenting with fall.  Fall Pertinent negatives include no numbness and no headaches.   79 y/o M with significant cardiac medical history, past CVA and dementia that presents to ED after a fall. His step-son is in the room and narrates the event. Pt has been in this hospital since yesterday due to wife's hospitalization and family members have noticed jerking movements "spasms or hiccups-like" intermittently throughout the day. They are isolated but in total they have counted up to 10 of them. This AM pt was walking and trying to turn when he had one of this "spasms" and loss balance falling slowly and hitting mildly left side of head, shoulder and low portion of leg. Pt denies any pain coming from this sites. He denies lightheadedness or dizziness prior, during or after events. Denies focal weakness, numbness or tingling. No headaches, chest pain, palpitations or SOB. Pt son bring him to get evaluated due to uncertainty of this new onset spasms.  Past Medical History  Diagnosis Date  . CAROTID ARTERY DISEASE 08/20/2009    doppler 2/10:  0-39% bilat ICA  / Doppler, September 21, 2010, stable, 0-39% bilateral  . Essential hypertension, benign 08/22/2008  . SINUS BRADYCARDIA 08/22/2008  . Edema 08/21/2009    Started January, 2011, probable mild volume overload and venous insufficiency  . Aortic stenosis     Mild, echo, March, 2011 / mild, echo, April, 2012  . Aortic insufficiency     Moderate, echo, March, 2011 / Moderate, echo, April, 2012  . Atherosclerosis     aorta; duplex 1/12: stable, no dissection, mid to distal irregular plaque with ulcerative appearance similar to prior study; severe stenosis IMA  . Aortic root dilation     Mild dilatation thoracic aorta  . Hemoptysis     Significant  outpatient episode February 2 011, related to pneumonia by her  . Ejection fraction     60% echo March, 2011 / EF 60-65%,, September 21, 2010, echo  . Pneumonia     Right middle lobe, February, 2011 resolved  . Pre-syncope   . Varicose veins     Patient had a procedure related to the veins in his legs, 2012  . TIA (transient ischemic attack)     Possible TIA July, 2012, /  episode of staring April 04, 2011    History reviewed. No pertinent past surgical history.  No family history on file.  History  Substance Use Topics  . Smoking status: Former Smoker    Quit date: 06/03/1970  . Smokeless tobacco: Not on file  . Alcohol Use: No    Review of Systems  Constitutional: Negative.   HENT: Negative.   Respiratory: Negative.   Cardiovascular: Negative for chest pain and palpitations.  Gastrointestinal: Negative.   Genitourinary: Negative.   Musculoskeletal: Negative.   Neurological: Negative for dizziness, weakness, light-headedness, numbness and headaches.    Allergies  Review of patient's allergies indicates no known allergies.  Home Medications   Current Outpatient Rx  Name  Route  Sig  Dispense  Refill  . ALPRAZolam (XANAX) 0.5 MG tablet   Oral   Take 0.5 mg by mouth as needed.           Marland Kitchen amLODipine (NORVASC) 5  MG tablet   Oral   Take 1 tablet (5 mg total) by mouth daily.   30 tablet   2   . aspirin 81 MG tablet   Oral   Take 81 mg by mouth daily.           . cephALEXin (KEFLEX) 500 MG capsule   Oral   Take 1 capsule (500 mg total) by mouth 4 (four) times daily.   40 capsule   0   . docusate sodium (COLACE) 100 MG capsule   Oral   Take 100 mg by mouth as needed.           . donepezil (ARICEPT) 5 MG tablet   Oral   Take 5 mg by mouth at bedtime.         . furosemide (LASIX) 40 MG tablet   Oral   Take 20 mg by mouth See admin instructions. 1 tab M, W, F         . hydrochlorothiazide 25 MG tablet   Oral   Take 25 mg by mouth daily.            Marland Kitchen HYDROcodone-acetaminophen (NORCO) 5-325 MG per tablet   Oral   Take 1 tablet by mouth every 6 (six) hours as needed. pain         . meloxicam (MOBIC) 7.5 MG tablet   Oral   Take 7.5 mg by mouth daily as needed.         . methylPREDNISolone (MEDROL, PAK,) 4 MG tablet      follow package directions   21 tablet   0   . metolazone (ZAROXOLYN) 2.5 MG tablet   Oral   Take 2.5 mg by mouth daily.         . quinapril (ACCUPRIL) 40 MG tablet   Oral   Take 1 tablet (40 mg total) by mouth daily.   90 tablet   0     Pt need to make an appt.Ph:[phone]   . triamcinolone cream (KENALOG) 0.1 %   Topical   Apply topically 2 (two) times daily. Do not apply on face or genital area. Use as needed for itching   45 g   0     BP 181/50  Pulse 60  Temp(Src) 97.9 F (36.6 C) (Oral)  Resp 16  SpO2 99%  Physical Exam Gen:  Pleasant and cooperative. Difficulty hearing (pt has hearing device).NAD HEENT: Moist mucous membranes. Neck supple. No adenopathies.  CV: Regular rate and rhythm, sytolic murmur IV/ VI audible on all precordium with irradiation to the neck. No gallops. PULM: Clear to auscultation bilaterally. No wheezes/rales/rhonchi ABD: Soft, non tender, non distended, normal bowel sounds EXT: No edema. Left lateral aspect of leg with two 1.5 to 2 cm skin abrasions.  Neuro: Alert and oriented x 3. No focalization. 5/5 strength on all extremities. Intact sensation. Shuffling gait. No tremors.  Psych: normal affect and mood. No agitation.   ED Course  Procedures (including critical care time)  Labs Reviewed  COMPREHENSIVE METABOLIC PANEL - Abnormal; Notable for the following:    Glucose, Bld 113 (*)    Albumin 3.4 (*)    GFR calc non Af Amer 76 (*)    GFR calc Af Amer 88 (*)    All other components within normal limits  CBC - Abnormal; Notable for the following:    Hemoglobin 12.3 (*)    HCT 36.8 (*)    All other components within normal limits  MAGNESIUM    Ct Head Wo Contrast  08/14/2012  *RADIOLOGY REPORT*  Clinical Data: Altered mental status.  Fall with a blow to the right side of the head.  CT HEAD WITHOUT CONTRAST  Technique:  Contiguous axial images were obtained from the base of the skull through the vertex without contrast.  Comparison: Head CT scan 04/12/2011.  Findings: There is some cortical atrophy and chronic microvascular ischemic change.  No evidence of acute intracranial abnormality including infarction, hemorrhage, mass lesion, mass effect, midline shift or abnormal extra-axial fluid collection.  No hydrocephalus or pneumocephalus.  The calvarium is intact.  IMPRESSION: No acute abnormality.   Original Report Authenticated By: Holley Dexter, M.D.    1. Myoclonus     MDM  CMET, Mg CT head negative F/u as outpatient with pt's neurologist.         Dayarmys Piloto de Criselda Peaches, MD 08/14/12 1010

## 2012-08-14 NOTE — ED Provider Notes (Signed)
Medical screening examination/treatment/procedure(s) were performed by non-physician practitioner and as supervising physician I was immediately available for consultation/collaboration.   Nelia Shi, MD 08/14/12 [phone]

## 2012-08-14 NOTE — ED Notes (Signed)
Step-son reports his Father was staying over-night with patient's wife. This am  standing in room had a hiccup knees gave out from him causing him to loose his balance leaned up against the wall hitting his head & slide down wall unable to keep himself from fall due to a hiccup. Sustained 2 skin abrasion to left lower leg.

## 2013-02-11 ENCOUNTER — Emergency Department (HOSPITAL_COMMUNITY): Payer: Medicare Other

## 2013-02-11 ENCOUNTER — Emergency Department (HOSPITAL_COMMUNITY)
Admission: EM | Admit: 2013-02-11 | Discharge: 2013-02-11 | Disposition: A | Discharge status: Home / Self Care | Payer: Medicare Other | Attending: Emergency Medicine | Admitting: Emergency Medicine

## 2013-02-11 ENCOUNTER — Encounter (HOSPITAL_COMMUNITY): Payer: Self-pay | Admitting: Emergency Medicine

## 2013-02-11 DIAGNOSIS — F0390 Unspecified dementia without behavioral disturbance: Secondary | ICD-10-CM | POA: Insufficient documentation

## 2013-02-11 DIAGNOSIS — Z79899 Other long term (current) drug therapy: Secondary | ICD-10-CM | POA: Insufficient documentation

## 2013-02-11 DIAGNOSIS — Z7982 Long term (current) use of aspirin: Secondary | ICD-10-CM | POA: Insufficient documentation

## 2013-02-11 DIAGNOSIS — W19XXXA Unspecified fall, initial encounter: Secondary | ICD-10-CM

## 2013-02-11 DIAGNOSIS — Z8679 Personal history of other diseases of the circulatory system: Secondary | ICD-10-CM | POA: Insufficient documentation

## 2013-02-11 DIAGNOSIS — S0100XA Unspecified open wound of scalp, initial encounter: Secondary | ICD-10-CM | POA: Insufficient documentation

## 2013-02-11 DIAGNOSIS — Z8673 Personal history of transient ischemic attack (TIA), and cerebral infarction without residual deficits: Secondary | ICD-10-CM | POA: Insufficient documentation

## 2013-02-11 DIAGNOSIS — Z8701 Personal history of pneumonia (recurrent): Secondary | ICD-10-CM | POA: Insufficient documentation

## 2013-02-11 DIAGNOSIS — R011 Cardiac murmur, unspecified: Secondary | ICD-10-CM | POA: Insufficient documentation

## 2013-02-11 DIAGNOSIS — Z87891 Personal history of nicotine dependence: Secondary | ICD-10-CM | POA: Insufficient documentation

## 2013-02-11 DIAGNOSIS — IMO0002 Reserved for concepts with insufficient information to code with codable children: Secondary | ICD-10-CM | POA: Insufficient documentation

## 2013-02-11 DIAGNOSIS — S0101XA Laceration without foreign body of scalp, initial encounter: Secondary | ICD-10-CM

## 2013-02-11 DIAGNOSIS — Y9389 Activity, other specified: Secondary | ICD-10-CM | POA: Insufficient documentation

## 2013-02-11 DIAGNOSIS — W1809XA Striking against other object with subsequent fall, initial encounter: Secondary | ICD-10-CM | POA: Insufficient documentation

## 2013-02-11 DIAGNOSIS — Y9210 Unspecified residential institution as the place of occurrence of the external cause: Secondary | ICD-10-CM | POA: Insufficient documentation

## 2013-02-11 LAB — CBC WITH DIFFERENTIAL/PLATELET
Basophils Absolute: 0 10*3/uL (ref 0.0–0.1)
Eosinophils Absolute: 0.2 10*3/uL (ref 0.0–0.7)
Eosinophils Relative: 4 % (ref 0–5)
MCH: 29.1 pg (ref 26.0–34.0)
MCHC: 33.6 g/dL (ref 30.0–36.0)
MCV: 86.7 fL (ref 78.0–100.0)
Platelets: 287 10*3/uL (ref 150–400)
RDW: 15 % (ref 11.5–15.5)

## 2013-02-11 LAB — TROPONIN I: Troponin I: <0.3 ng/mL (ref <0.30)

## 2013-02-11 LAB — COMPREHENSIVE METABOLIC PANEL
AST: 22 U/L (ref 0–37)
BUN: 26 mg/dL — ABNORMAL HIGH (ref 6–23)
CO2: 30 mEq/L (ref 19–32)
Calcium: 9.9 mg/dL (ref 8.4–10.5)
Creatinine, Ser: 1.02 mg/dL (ref 0.50–1.35)
GFR calc non Af Amer: 65 mL/min — ABNORMAL LOW (ref >90)

## 2013-02-11 LAB — URINALYSIS, ROUTINE W REFLEX MICROSCOPIC
Bilirubin Urine: NEGATIVE
Glucose, UA: NEGATIVE mg/dL
Ketones, ur: NEGATIVE mg/dL
Leukocytes, UA: NEGATIVE
Nitrite: NEGATIVE
Protein, ur: NEGATIVE mg/dL

## 2013-02-11 NOTE — ED Notes (Addendum)
Per ems pt fell and hit his head only on ASA daily pt is from [facility] @ [facility]  Per wife pt rolled out of bed and hit his head

## 2013-02-11 NOTE — ED Notes (Signed)
Patient transported to CT 

## 2013-02-11 NOTE — ED Provider Notes (Signed)
CSN: [id number]     Arrival date & time 02/11/13  1845 History   First MD Initiated Contact with Patient 02/11/13 1905     Chief Complaint  Patient presents with  . Fall   (Consider location/radiation/quality/duration/timing/severity/associated sxs/prior Treatment) HPI Comments: Patient lives in assisted living with his wife.  Hx dementia.  Patient was sitting on the side of the bed, getting undressed and ready for bed when he fell and hit his head on the bedrail.  The fall was unwitnessed but wife and staff were very soon at his bedside and he was alert.  Son notes patient has his pants around his knees as if he got tangled up or lost his balance while trying to get them off.  Patient is unable to comment.  Pt does deny any CP, abdominal pain, SOB, cough, N/V/D.    Level V caveat for dementia.   The history is provided by the patient.    Past Medical History  Diagnosis Date  . CAROTID ARTERY DISEASE 08/20/2009    doppler 2/10:  0-39% bilat ICA  / Doppler, September 21, 2010, stable, 0-39% bilateral  . Essential hypertension, benign 08/22/2008  . SINUS BRADYCARDIA 08/22/2008  . Edema 08/21/2009    Started January, 2011, probable mild volume overload and venous insufficiency  . Aortic stenosis     Mild, echo, March, 2011 / mild, echo, April, 2012  . Aortic insufficiency     Moderate, echo, March, 2011 / Moderate, echo, April, 2012  . Atherosclerosis     aorta; duplex 1/12: stable, no dissection, mid to distal irregular plaque with ulcerative appearance similar to prior study; severe stenosis IMA  . Aortic root dilation     Mild dilatation thoracic aorta  . Hemoptysis     Significant outpatient episode February 2 011, related to pneumonia by her  . Ejection fraction     60% echo March, 2011 / EF 60-65%,, September 21, 2010, echo  . Pneumonia     Right middle lobe, February, 2011 resolved  . Pre-syncope   . Varicose veins     Patient had a procedure related to the veins in his legs, 2012  .  TIA (transient ischemic attack)     Possible TIA July, 2012, /  episode of staring April 04, 2011   History reviewed. No pertinent past surgical history. No family history on file. History  Substance Use Topics  . Smoking status: Former Smoker    Quit date: 06/03/1970  . Smokeless tobacco: Not on file  . Alcohol Use: No     Comment: pt recieve 1 beer daily    Review of Systems  Unable to perform ROS: Dementia    Allergies  Review of patient's allergies indicates no known allergies.  Home Medications   Current Outpatient Rx  Name  Route  Sig  Dispense  Refill  . acetaminophen (TYLENOL) 325 MG tablet   Oral   Take 650 mg by mouth every 4 (four) hours as needed for pain.         Marland Kitchen ALPRAZolam (XANAX) 0.5 MG tablet   Oral   Take 0.5 mg by mouth as needed.           . ALPRAZolam (XANAX) 0.5 MG tablet   Oral   Take 0.5 mg by mouth at bedtime as needed for sleep.         Marland Kitchen aspirin 81 MG tablet   Oral   Take 81 mg by mouth daily.           Marland Kitchen  furosemide (LASIX) 40 MG tablet   Oral   Take 20 mg by mouth See admin instructions. 1 tab M, W, F         . hydrochlorothiazide 25 MG tablet   Oral   Take 25 mg by mouth daily.           . mometasone (ELOCON) 0.1 % cream   Topical   Apply 1 application topically daily. Apply small amount to affected skin of both legs below the knee daily         . quinapril (ACCUPRIL) 40 MG tablet   Oral   Take 40 mg by mouth daily.          BP 208/67  Pulse 84  Temp(Src) 98.2 F (36.8 C) (Oral)  Resp 20  SpO2 98% Physical Exam  Nursing note and vitals reviewed. Constitutional: He appears well-developed and well-nourished. No distress.  HENT:  Head: Normocephalic.    Neck: Neck supple.  Cardiovascular: Normal rate, regular rhythm and intact distal pulses.   Murmur heard. Pulmonary/Chest: Effort normal and breath sounds normal. No respiratory distress. He has no wheezes. He has no rales. He exhibits no tenderness.   Abdominal: Soft. He exhibits no distension and no mass. There is no tenderness. There is no rebound and no guarding.  Neurological: He is alert. He has normal strength. He exhibits normal muscle tone. GCS eye subscore is 4. GCS verbal subscore is 5. GCS motor subscore is 6.  Pt is alert, follows commands.   Skin: He is not diaphoretic.    ED Course  Procedures (including critical care time) Labs Review Labs Reviewed  CBC WITH DIFFERENTIAL - Abnormal; Notable for the following:    RBC 4.05 (*)    Hemoglobin 11.8 (*)    HCT 35.1 (*)    All other components within normal limits  URINALYSIS, ROUTINE W REFLEX MICROSCOPIC - Abnormal; Notable for the following:    APPearance CLOUDY (*)    All other components within normal limits  COMPREHENSIVE METABOLIC PANEL - Abnormal; Notable for the following:    Glucose, Bld 104 (*)    BUN 26 (*)    Total Bilirubin 0.2 (*)    GFR calc non Af Amer 65 (*)    GFR calc Af Amer 76 (*)    All other components within normal limits  TROPONIN I   Imaging Review Dg Chest 2 View  02/11/2013   *RADIOLOGY REPORT*  Clinical Data: Status post fall  CHEST - 2 VIEW  Comparison: 07/23/2009  Findings: Heart size is normal.  No pleural effusion or edema.  No airspace consolidation identified.  Lung volumes are low.  There are coarsened interstitial markings noted bilaterally.  Chronic right posterior rib fracture deformities are identified.  IMPRESSION:  1.  Low lung volumes. 2.  Chronic right posterior rib fractures.   Original Report Authenticated By: Signa Kell, M.D.   Ct Head Wo Contrast  02/11/2013   *RADIOLOGY REPORT*  Clinical Data:  Fall.  CT HEAD WITHOUT CONTRAST CT CERVICAL SPINE WITHOUT CONTRAST  Technique:  Multidetector CT imaging of the head and cervical spine was performed following the standard protocol without intravenous contrast.  Multiplanar CT image reconstructions of the cervical spine were also generated.  Comparison:  08/14/2012  CT HEAD   Findings: There is diffuse patchy low density throughout the subcortical and periventricular white matter consistent with chronic small vessel ischemic change.  There is prominence of the sulci and ventricles consistent with brain atrophy.  There is no evidence for acute brain infarct, hemorrhage or mass.  The mastoid air cells are clear.  The paranasal sinuses are clear. The skull is intact.  IMPRESSION:  1.  No acute intracranial abnormalities. 2.  Small vessel ischemic change and brain atrophy.  CT CERVICAL SPINE  Findings: Normal alignment of the cervical spine.  The facet joints are all well aligned. There is mild multilevel disc space narrowing and ventral endplate spurring.  No fractures identified.  The prevertebral soft tissue space is unremarkable.  IMPRESSION: Negative exam.   Original Report Authenticated By: Signa Kell, M.D.   Ct Cervical Spine Wo Contrast  02/11/2013   *RADIOLOGY REPORT*  Clinical Data:  Fall.  CT HEAD WITHOUT CONTRAST CT CERVICAL SPINE WITHOUT CONTRAST  Technique:  Multidetector CT imaging of the head and cervical spine was performed following the standard protocol without intravenous contrast.  Multiplanar CT image reconstructions of the cervical spine were also generated.  Comparison:  08/14/2012  CT HEAD  Findings: There is diffuse patchy low density throughout the subcortical and periventricular white matter consistent with chronic small vessel ischemic change.  There is prominence of the sulci and ventricles consistent with brain atrophy.  There is no evidence for acute brain infarct, hemorrhage or mass.  The mastoid air cells are clear.  The paranasal sinuses are clear. The skull is intact.  IMPRESSION:  1.  No acute intracranial abnormalities. 2.  Small vessel ischemic change and brain atrophy.  CT CERVICAL SPINE  Findings: Normal alignment of the cervical spine.  The facet joints are all well aligned. There is mild multilevel disc space narrowing and ventral endplate  spurring.  No fractures identified.  The prevertebral soft tissue space is unremarkable.  IMPRESSION: Negative exam.   Original Report Authenticated By: Signa Kell, M.D.   LACERATION REPAIR Performed by: Rise Patience  Performed by Danie Binder, PA-S under my supervision  Authorized by: Rise Patience Consent: Verbal consent obtained. Risks and benefits: risks, benefits and alternatives were discussed Consent given by: patient Patient identity confirmed: provided demographic data Prepped and Draped in normal sterile fashion Wound explored  Laceration Location: right occiput  Laceration Length: 2.5cm  No Foreign Bodies seen or palpated  Anesthesia: none  Irrigation method: lavage Amount of cleaning: standard  Skin closure: staples  Number of sutures: 4  Technique: staples  Patient tolerance: Patient tolerated the procedure well with no immediate complications.   MDM   1. Fall, initial encounter   2. Scalp laceration, initial encounter      Elderly patient with dementia presented after fall at his assisted living facility.  The actual fall was unwitnessed but he was found momentarily afterwards to be alert.  He is at his baseline mentally.  Laceration repaired by PA student in ED.  CT head, c-spine negative. CXR, labs unremarkable. UA negative.  Wound stapled in ED.  Tetanus up to date per family.  Pt d/c home with PCP follow up.  Discussed all results with family.  Pt given return precautions.  Family verbalizes understanding and agrees with plan.       Douglass, PA-C 02/12/13 [phone]

## 2013-02-11 NOTE — ED Notes (Signed)
Bed: WA04 Expected date:  Expected time:  Means of arrival:  Comments: EMS-fall-hit head

## 2013-02-11 NOTE — Progress Notes (Signed)
CSW met with pt at bedside.  Pt was able to confirm that he is a resident of [facility]. Pts family was at his bedside and confirmed that the plan is for pt to return to facility once he is medically stable.  His stepson, Julieanne Manson, confirmed that he is HCPOA.  His contact number is [phone].  Family thanked CSW for concern.    Marva Panda, LCSWA  [phone] .02/11/2013 9:00 pm

## 2013-02-12 NOTE — ED Provider Notes (Signed)
Date: 02/12/2013  Rate: 66  Rhythm: normal sinus rhythm  QRS Axis: normal  Intervals: normal  ST/T Wave abnormalities: nonspecific ST/T changes and Q waves  Conduction Disutrbances:first-degree A-V block   Narrative Interpretation: LVH  Old EKG Reviewed: unchanged    Trixie Dredge, PA-C 02/12/13 0102

## 2013-02-13 NOTE — ED Provider Notes (Signed)
Medical screening examination/treatment/procedure(s) were performed by non-physician practitioner and as supervising physician I was immediately available for consultation/collaboration.   Laray Anger, DO 02/13/13 1425

## 2013-02-13 NOTE — ED Provider Notes (Signed)
Medical screening examination/treatment/procedure(s) were performed by non-physician practitioner and as supervising physician I was immediately available for consultation/collaboration.   Julian Medina M Akshitha Culmer, DO 02/13/13 1425 

## 2013-07-01 ENCOUNTER — Emergency Department (HOSPITAL_COMMUNITY): Payer: Medicare Other

## 2013-07-01 ENCOUNTER — Encounter (HOSPITAL_COMMUNITY): Payer: Self-pay | Admitting: Emergency Medicine

## 2013-07-01 ENCOUNTER — Emergency Department (HOSPITAL_COMMUNITY)
Admission: EM | Admit: 2013-07-01 | Discharge: 2013-07-02 | Disposition: A | Discharge status: Home / Self Care | Payer: Medicare Other | Attending: Emergency Medicine | Admitting: Emergency Medicine

## 2013-07-01 DIAGNOSIS — F0390 Unspecified dementia without behavioral disturbance: Secondary | ICD-10-CM | POA: Insufficient documentation

## 2013-07-01 DIAGNOSIS — R4182 Altered mental status, unspecified: Secondary | ICD-10-CM | POA: Insufficient documentation

## 2013-07-01 DIAGNOSIS — Z79899 Other long term (current) drug therapy: Secondary | ICD-10-CM | POA: Insufficient documentation

## 2013-07-01 DIAGNOSIS — Y9210 Unspecified residential institution as the place of occurrence of the external cause: Secondary | ICD-10-CM | POA: Insufficient documentation

## 2013-07-01 DIAGNOSIS — Z8673 Personal history of transient ischemic attack (TIA), and cerebral infarction without residual deficits: Secondary | ICD-10-CM | POA: Insufficient documentation

## 2013-07-01 DIAGNOSIS — Z87891 Personal history of nicotine dependence: Secondary | ICD-10-CM | POA: Insufficient documentation

## 2013-07-01 DIAGNOSIS — Y9389 Activity, other specified: Secondary | ICD-10-CM | POA: Insufficient documentation

## 2013-07-01 DIAGNOSIS — W219XXA Striking against or struck by unspecified sports equipment, initial encounter: Secondary | ICD-10-CM | POA: Insufficient documentation

## 2013-07-01 DIAGNOSIS — S51009A Unspecified open wound of unspecified elbow, initial encounter: Secondary | ICD-10-CM | POA: Insufficient documentation

## 2013-07-01 DIAGNOSIS — I2510 Atherosclerotic heart disease of native coronary artery without angina pectoris: Secondary | ICD-10-CM | POA: Insufficient documentation

## 2013-07-01 DIAGNOSIS — Z8701 Personal history of pneumonia (recurrent): Secondary | ICD-10-CM | POA: Insufficient documentation

## 2013-07-01 DIAGNOSIS — IMO0002 Reserved for concepts with insufficient information to code with codable children: Secondary | ICD-10-CM | POA: Insufficient documentation

## 2013-07-01 DIAGNOSIS — I10 Essential (primary) hypertension: Secondary | ICD-10-CM | POA: Insufficient documentation

## 2013-07-01 DIAGNOSIS — Z7982 Long term (current) use of aspirin: Secondary | ICD-10-CM | POA: Insufficient documentation

## 2013-07-01 DIAGNOSIS — J111 Influenza due to unidentified influenza virus with other respiratory manifestations: Secondary | ICD-10-CM | POA: Insufficient documentation

## 2013-07-01 LAB — URINALYSIS, ROUTINE W REFLEX MICROSCOPIC
Bilirubin Urine: NEGATIVE
Glucose, UA: NEGATIVE mg/dL
KETONES UR: NEGATIVE mg/dL
LEUKOCYTES UA: NEGATIVE
NITRITE: NEGATIVE
PH: 6 (ref 5.0–8.0)
Protein, ur: NEGATIVE mg/dL
SPECIFIC GRAVITY, URINE: 1.023 (ref 1.005–1.030)
Urobilinogen, UA: 0.2 mg/dL (ref 0.0–1.0)

## 2013-07-01 LAB — COMPREHENSIVE METABOLIC PANEL
ALT: 18 U/L (ref 0–53)
AST: 31 U/L (ref 0–37)
Albumin: 3.2 g/dL — ABNORMAL LOW (ref 3.5–5.2)
Alkaline Phosphatase: 80 U/L (ref 39–117)
BUN: 26 mg/dL — AB (ref 6–23)
CALCIUM: 9.1 mg/dL (ref 8.4–10.5)
CO2: 28 mEq/L (ref 19–32)
CREATININE: 1.16 mg/dL (ref 0.50–1.35)
Chloride: 102 mEq/L (ref 96–112)
GFR calc non Af Amer: 56 mL/min — ABNORMAL LOW (ref >90)
GFR, EST AFRICAN AMERICAN: 65 mL/min — AB (ref >90)
GLUCOSE: 88 mg/dL (ref 70–99)
Potassium: 4.9 mEq/L (ref 3.7–5.3)
Sodium: 140 mEq/L (ref 137–147)
TOTAL PROTEIN: 6.6 g/dL (ref 6.0–8.3)
Total Bilirubin: 0.2 mg/dL — ABNORMAL LOW (ref 0.3–1.2)

## 2013-07-01 LAB — CBC WITH DIFFERENTIAL/PLATELET
BASOS PCT: 0 % (ref 0–1)
Basophils Absolute: 0 10*3/uL (ref 0.0–0.1)
EOS ABS: 0 10*3/uL (ref 0.0–0.7)
Eosinophils Relative: 1 % (ref 0–5)
HEMATOCRIT: 34.6 % — AB (ref 39.0–52.0)
HEMOGLOBIN: 11.9 g/dL — AB (ref 13.0–17.0)
Lymphocytes Relative: 22 % (ref 12–46)
Lymphs Abs: 1 10*3/uL (ref 0.7–4.0)
MCH: 30.9 pg (ref 26.0–34.0)
MCHC: 34.4 g/dL (ref 30.0–36.0)
MCV: 89.9 fL (ref 78.0–100.0)
MONO ABS: 0.4 10*3/uL (ref 0.1–1.0)
Monocytes Relative: 8 % (ref 3–12)
NEUTROS ABS: 3.2 10*3/uL (ref 1.7–7.7)
Neutrophils Relative %: 69 % (ref 43–77)
Platelets: 185 10*3/uL (ref 150–400)
RBC: 3.85 MIL/uL — ABNORMAL LOW (ref 4.22–5.81)
RDW: 14.2 % (ref 11.5–15.5)
WBC: 4.6 10*3/uL (ref 4.0–10.5)

## 2013-07-01 LAB — URINE MICROSCOPIC-ADD ON

## 2013-07-01 LAB — MAGNESIUM: Magnesium: 2 mg/dL (ref 1.5–2.5)

## 2013-07-01 LAB — PROTIME-INR
INR: 1.32 (ref 0.00–1.49)
Prothrombin Time: 16.1 seconds — ABNORMAL HIGH (ref 11.6–15.2)

## 2013-07-01 LAB — INFLUENZA PANEL BY PCR (TYPE A & B)
H1N1 flu by pcr: NOT DETECTED
INFLBPCR: NEGATIVE
Influenza A By PCR: POSITIVE — AB

## 2013-07-01 LAB — TROPONIN I: Troponin I: <0.3 ng/mL (ref <0.30)

## 2013-07-01 LAB — APTT: aPTT: 53 seconds — ABNORMAL HIGH (ref 24–37)

## 2013-07-01 MED ORDER — SODIUM CHLORIDE 0.9 % IV BOLUS (SEPSIS)
1000.0000 mL | Freq: Once | INTRAVENOUS | Status: AC
  Administered 2013-07-01: 1000 mL via INTRAVENOUS

## 2013-07-01 MED ORDER — OSELTAMIVIR PHOSPHATE 30 MG PO CAPS
30.0000 mg | ORAL_CAPSULE | Freq: Once | ORAL | Status: AC
  Administered 2013-07-01: 30 mg via ORAL
  Filled 2013-07-01 (×2): qty 1

## 2013-07-01 MED ORDER — OSELTAMIVIR PHOSPHATE 75 MG PO CAPS: 75.0000 mg | ORAL_CAPSULE | Freq: Once | ORAL | Status: DC

## 2013-07-01 MED ORDER — OSELTAMIVIR PHOSPHATE 30 MG PO CAPS: 30.0000 mg | ORAL_CAPSULE | Freq: Two times a day (BID) | ORAL | Status: DC

## 2013-07-01 MED ORDER — ACETAMINOPHEN 650 MG RE SUPP
975.0000 mg | Freq: Once | RECTAL | Status: AC
  Administered 2013-07-01: 975 mg via RECTAL
  Filled 2013-07-01 (×2): qty 1

## 2013-07-01 NOTE — ED Notes (Signed)
Pt has skin tear to right elbow. Bacitracin applied, gauze dressing. Has several linear abrasions to right sided flank. Superficial. Bleeding controlled.

## 2013-07-01 NOTE — ED Notes (Signed)
Patient transported to X-ray 

## 2013-07-01 NOTE — ED Notes (Signed)
Paged PTAR   

## 2013-07-01 NOTE — Discharge Instructions (Signed)

## 2013-07-01 NOTE — ED Provider Notes (Signed)
CSN: [id number]     Arrival date & time 07/01/13  0945 History   First MD Initiated Contact with Patient 07/01/13 [phone]     Chief Complaint  Patient presents with  . Seizures   (Consider location/radiation/quality/duration/timing/severity/associated sxs/prior Treatment) Patient is a 80 y.o. male presenting with seizures.  Seizures  Level 5 caveat due to AMS Pt brought by EMS after seizure activity this AM while sitting on the commode. Per their reports, patient was shaking all over. Helped to stretcher during which time he sustained a skin tear to R arm, but did not have a fall otherwise. Given Versed enroute and no further seizure activity noted. No further history available.   Past Medical History  Diagnosis Date  . CAROTID ARTERY DISEASE 08/20/2009    doppler 2/10:  0-39% bilat ICA  / Doppler, September 21, 2010, stable, 0-39% bilateral  . Essential hypertension, benign 08/22/2008  . SINUS BRADYCARDIA 08/22/2008  . Edema 08/21/2009    Started January, 2011, probable mild volume overload and venous insufficiency  . Aortic stenosis     Mild, echo, March, 2011 / mild, echo, April, 2012  . Aortic insufficiency     Moderate, echo, March, 2011 / Moderate, echo, April, 2012  . Atherosclerosis     aorta; duplex 1/12: stable, no dissection, mid to distal irregular plaque with ulcerative appearance similar to prior study; severe stenosis IMA  . Aortic root dilation     Mild dilatation thoracic aorta  . Hemoptysis     Significant outpatient episode February 2 011, related to pneumonia by her  . Ejection fraction     60% echo March, 2011 / EF 60-65%,, September 21, 2010, echo  . Pneumonia     Right middle lobe, February, 2011 resolved  . Pre-syncope   . Varicose veins     Patient had a procedure related to the veins in his legs, 2012  . TIA (transient ischemic attack)     Possible TIA July, 2012, /  episode of staring April 04, 2011   History reviewed. No pertinent past surgical history. No  family history on file. History  Substance Use Topics  . Smoking status: Former Smoker    Quit date: 06/03/1970  . Smokeless tobacco: Not on file  . Alcohol Use: No     Comment: pt recieve 1 beer daily    Review of Systems  Neurological: Positive for seizures.   All other systems reviewed and are negative except as noted in HPI.   Allergies  Review of patient's allergies indicates no known allergies.  Home Medications   Current Outpatient Rx  Name  Route  Sig  Dispense  Refill  . acetaminophen (TYLENOL) 325 MG tablet   Oral   Take 650 mg by mouth every 4 (four) hours as needed for pain.         Marland Kitchen ALPRAZolam (XANAX) 0.5 MG tablet   Oral   Take 0.5 mg by mouth at bedtime as needed.          . ALPRAZolam (XANAX) 0.5 MG tablet   Oral   Take 0.5 mg by mouth every 6 (six) hours as needed for anxiety or sleep.          Marland Kitchen aspirin 81 MG tablet   Oral   Take 81 mg by mouth daily.           . divalproex (DEPAKOTE) 125 MG DR tablet   Oral   Take 125 mg by mouth at bedtime.         Marland Kitchen  furosemide (LASIX) 20 MG tablet   Oral   Take 20 mg by mouth every Monday, Wednesday, and Friday.         . haloperidol (HALDOL) 0.5 MG tablet   Oral   Take 0.5 mg by mouth at bedtime.         . hydrochlorothiazide 25 MG tablet   Oral   Take 25 mg by mouth daily.           . mometasone (ELOCON) 0.1 % cream   Topical   Apply 1 application topically daily. Apply small amount to affected skin of both legs below the knee daily         . quinapril (ACCUPRIL) 40 MG tablet   Oral   Take 40 mg by mouth daily.         . traZODone (DESYREL) 50 MG tablet   Oral   Take 50 mg by mouth at bedtime.          BP 153/46  Pulse 66  Temp(Src) 101.9 F (38.8 C) (Rectal)  Resp 17  SpO2 96% Physical Exam  Nursing note and vitals reviewed. Constitutional: He appears well-developed and well-nourished.  HENT:  Head: Normocephalic and atraumatic.  Eyes: EOM are normal. Pupils  are equal, round, and reactive to light.  Neck: Normal range of motion. Neck supple.  Cardiovascular: Normal rate, normal heart sounds and intact distal pulses.   Pulmonary/Chest: Effort normal and breath sounds normal.  Abdominal: Bowel sounds are normal. He exhibits no distension. There is no tenderness.  Musculoskeletal: Normal range of motion. He exhibits no edema and no tenderness.  Neurological:  Unable to assess due to mental status  Skin: Skin is warm and dry. No rash noted.    ED Course  Procedures (including critical care time) Labs Review Labs Reviewed  CBC WITH DIFFERENTIAL - Abnormal; Notable for the following:    RBC 3.85 (*)    Hemoglobin 11.9 (*)    HCT 34.6 (*)    All other components within normal limits  COMPREHENSIVE METABOLIC PANEL - Abnormal; Notable for the following:    BUN 26 (*)    Albumin 3.2 (*)    Total Bilirubin 0.2 (*)    GFR calc non Af Amer 56 (*)    GFR calc Af Amer 65 (*)    All other components within normal limits  URINALYSIS, ROUTINE W REFLEX MICROSCOPIC - Abnormal; Notable for the following:    Hgb urine dipstick SMALL (*)    All other components within normal limits  PROTIME-INR - Abnormal; Notable for the following:    Prothrombin Time 16.1 (*)    All other components within normal limits  APTT - Abnormal; Notable for the following:    aPTT 53 (*)    All other components within normal limits  INFLUENZA PANEL BY PCR (TYPE A & B, H1N1) - Abnormal; Notable for the following:    Influenza A By PCR POSITIVE (*)    All other components within normal limits  URINE MICROSCOPIC-ADD ON - Abnormal; Notable for the following:    Casts HYALINE CASTS (*)    All other components within normal limits  TROPONIN I  MAGNESIUM   Imaging Review Dg Chest 2 View  07/01/2013   CLINICAL DATA:  Altered mental status.  Seizure.  EXAM: CHEST  2 VIEW  COMPARISON:  02/11/2013  FINDINGS: Heart size is normal. Main pulmonary arteries are chronically prominent  with chronic peribronchial thickening. Scarring at the right lung base  with multiple old right lateral rib fractures. There is no compression fracture in the mid thoracic spine.  No acute infiltrates or effusions.  IMPRESSION: No acute abnormalities.   Electronically Signed   By: Geanie Cooley M.D.   On: 07/01/2013 10:37   Ct Head Wo Contrast  07/01/2013   CLINICAL DATA:  Seizures  EXAM: CT HEAD WITHOUT CONTRAST  TECHNIQUE: Contiguous axial images were obtained from the base of the skull through the vertex without intravenous contrast.  COMPARISON:  CT HEAD W/O CM dated 02/11/2013  FINDINGS: There is no evidence of mass effect, midline shift, or extra-axial fluid collections. There is no evidence of a space-occupying lesion or intracranial hemorrhage. There is no evidence of a cortical-based area of acute infarction. There is generalized cerebral atrophy. There is periventricular white matter low attenuation likely secondary to microangiopathy.  The ventricles and sulci are appropriate for the patient's age. The basal cisterns are patent.  Visualized portions of the orbits are unremarkable. The visualized portions of the paranasal sinuses and mastoid air cells are unremarkable. Cerebrovascular atherosclerotic calcifications are noted.  The osseous structures are unremarkable.  IMPRESSION: No acute intracranial pathology.   Electronically Signed   By: Elige Ko   On: 07/01/2013 10:50    EKG Interpretation    Date/Time:  Thursday July 01 2013 09:59:01 EST Ventricular Rate:  89 PR Interval:  214 QRS Duration: 112 QT Interval:  401 QTC Calculation: 488 R Axis:   -30 Text Interpretation:  Sinus rhythm Borderline prolonged PR interval LVH with IVCD and secondary repol abnrm Anterior ST elevation, probably due to LVH Borderline prolonged QT interval Since last tracing Rate faster Non-specific ST-t changes Confirmed by SHELDON  MD, CHARLES ([id number]) on 07/01/2013 10:23:07 AM            MDM   1.  Influenza     Pt's family member who is also his PCP at bedside. Long discussion about fever and ?seizure. He agrees that seizure-like activity may have actually been rigors vs myoclonus which the patient has at baseline. He is back to his baseline mental status. Discussed LP vs treatment for positive influenza swab. PCP/family does not feel LP is necessary, will treat with Tamiflu and return to [facility].     Charles B. Bernette Mayers, MD 07/01/13 [phone]

## 2013-07-01 NOTE — ED Notes (Signed)
Pt's stepson arrived to be at bedside. Asking about glasses and hearing aid. RN nor Tech have seen these things since arrival at our facility.

## 2013-07-01 NOTE — ED Notes (Signed)
Per EMS- Pt comes from [facility] assisted living with hx of dementia. This morning started having seizures. Facility called EMS when pt had seizure on bedside commode. When they arrived he was having a seizure a full body seizure on the bedside commode. Was assisted to stretcher. No fall, has skin tear to his right elbow. Was given 2.5 versed. BP 143/103, HR 97, 96% RA, CBG 100. Is in SR. Pt is lethargic, responds to heavy stimulation.

## 2013-09-17 ENCOUNTER — Emergency Department (HOSPITAL_COMMUNITY): Payer: Medicare Other

## 2013-09-17 ENCOUNTER — Emergency Department (HOSPITAL_COMMUNITY)
Admission: EM | Admit: 2013-09-17 | Discharge: 2013-09-17 | Disposition: A | Discharge status: Home / Self Care | Payer: Medicare Other | Attending: Emergency Medicine | Admitting: Emergency Medicine

## 2013-09-17 ENCOUNTER — Encounter (HOSPITAL_COMMUNITY): Payer: Self-pay | Admitting: Emergency Medicine

## 2013-09-17 DIAGNOSIS — W06XXXA Fall from bed, initial encounter: Secondary | ICD-10-CM | POA: Insufficient documentation

## 2013-09-17 DIAGNOSIS — I10 Essential (primary) hypertension: Secondary | ICD-10-CM | POA: Insufficient documentation

## 2013-09-17 DIAGNOSIS — R011 Cardiac murmur, unspecified: Secondary | ICD-10-CM | POA: Insufficient documentation

## 2013-09-17 DIAGNOSIS — I2510 Atherosclerotic heart disease of native coronary artery without angina pectoris: Secondary | ICD-10-CM | POA: Insufficient documentation

## 2013-09-17 DIAGNOSIS — W19XXXA Unspecified fall, initial encounter: Secondary | ICD-10-CM

## 2013-09-17 DIAGNOSIS — S0100XA Unspecified open wound of scalp, initial encounter: Secondary | ICD-10-CM | POA: Insufficient documentation

## 2013-09-17 DIAGNOSIS — Z87891 Personal history of nicotine dependence: Secondary | ICD-10-CM | POA: Insufficient documentation

## 2013-09-17 DIAGNOSIS — Y9389 Activity, other specified: Secondary | ICD-10-CM | POA: Insufficient documentation

## 2013-09-17 DIAGNOSIS — Z8701 Personal history of pneumonia (recurrent): Secondary | ICD-10-CM | POA: Insufficient documentation

## 2013-09-17 DIAGNOSIS — Z79899 Other long term (current) drug therapy: Secondary | ICD-10-CM | POA: Insufficient documentation

## 2013-09-17 DIAGNOSIS — W1809XA Striking against other object with subsequent fall, initial encounter: Secondary | ICD-10-CM | POA: Insufficient documentation

## 2013-09-17 DIAGNOSIS — Z7982 Long term (current) use of aspirin: Secondary | ICD-10-CM | POA: Insufficient documentation

## 2013-09-17 DIAGNOSIS — Y9210 Unspecified residential institution as the place of occurrence of the external cause: Secondary | ICD-10-CM | POA: Insufficient documentation

## 2013-09-17 DIAGNOSIS — S0101XA Laceration without foreign body of scalp, initial encounter: Secondary | ICD-10-CM

## 2013-09-17 DIAGNOSIS — Y92129 Unspecified place in nursing home as the place of occurrence of the external cause: Secondary | ICD-10-CM

## 2013-09-17 DIAGNOSIS — Z8673 Personal history of transient ischemic attack (TIA), and cerebral infarction without residual deficits: Secondary | ICD-10-CM | POA: Insufficient documentation

## 2013-09-17 NOTE — ED Notes (Signed)
Pt [facility] from [facility] after falling out of the bed and hitting his head on the night stand.  With EMS pt denies neck and back pain and denies LOC.  Pt has a laceration on the left, upper head.  Pt is hard of hearing, hearing aid present in the left ear.

## 2013-09-17 NOTE — ED Notes (Signed)
Discharge papers given to step-son at bedside.  [facility] paper work sent with step-son.

## 2013-09-17 NOTE — Discharge Instructions (Signed)
Fall Prevention in Hospitals As a hospital patient, your condition and the treatments you receive can increase your risk for falls. Some additional risk factors for falls in a hospital include:  Being in an unfamiliar environment.  Being on bed rest.  Your surgery.  Taking certain medicines.  Your tubing requirements, such as intravenous (IV) therapy or catheters. It is important that you learn how to decrease fall risks while at the hospital. Below are important tips that can help prevent falls. SAFETY TIPS FOR PREVENTING FALLS Talk about your risk of falling.  Ask your caregiver why you are at risk for falling. Is it your medicine, illness, tubing placement, or something else?  Make a plan with your caregiver to keep you safe from falls.  Ask your caregiver or pharmacist about side effect of your medicines. Some medicines can make you dizzy or affect your coordination. Ask for help.  Ask for help before getting out of bed. You may need to press your call button.  Ask for assistance in getting you safely to the toilet.  Ask for a walker or cane to be put at your bedside. Ask that most of the side rails on your bed be placed up before your caregiver leaves the room.  Ask family or friends to sit with you.  Ask for things that are out of your reach, such as your glasses, hearing aids, telephone, bedside table, or call button. Follow these tips to avoid falling:  Stay lying or seated, rather than standing, while waiting for help.  Wear rubber-soled slippers or shoes whenever you walk in the hospital.  Avoid quick, sudden movements.  Change positions slowly.  Sit on the side of your bed before standing.  Stand up slowly and wait before you start to walk.  Let your caregiver know if there is a spill on the floor.  Pay careful attention to the medical equipment, electrical cords, and tubes around you.  When you need help, use your call button by your bed or in the  bathroom. Wait for one of your caregivers to help you.  If you feel dizzy or unsure of your footing, return to bed and wait for assistance.  Avoid being distracted by the TV, telephone, or another person in your room.  Do not lean or support yourself on rolling objects, such as IV poles or bedside tables. Document Released: 05/17/2000 Document Revised: 05/06/2012 Document Reviewed: 01/26/2012 [facility] Patient Information 2014 [employer], [place].  Staple Wound Closure Staples are used to help a wound heal faster by holding the edges of the wound together. HOME CARE  Keep the area around the staples clean and dry.  Rest and raise (elevate) the injured part above the level of your heart.  See your doctor for a follow-up check of the wound.  See your doctor to have the staples removed.  Clean the wound daily with water.  Do not soak the wound in water for long periods of time.  Let air reach the wound as it heals. GET HELP RIGHT AWAY IF:   You have redness or puffiness around the wound.  You have a red line going away from the wound.  You have more pain or tenderness.  You have yellowish-white fluid (pus) coming from the wound.  Your wound does not stay together after the staples have been taken out.  You see something coming out of the wound, such as wood or glass.  You have problems moving the injured area.  You have a  fever or lasting symptoms for more than 2-3 days.  You have a fever and your symptoms suddenly get worse. MAKE SURE YOU:   Understand these instructions.  Will watch this condition.  Will get help right away if you are not doing well or get worse. Document Released: 02/27/2008 Document Revised: 02/12/2012 Document Reviewed: 12/01/2011 [facility] Patient Information 2014 [place], [place].

## 2013-09-17 NOTE — ED Provider Notes (Signed)
CSN: [id number]     Arrival date & time 09/17/13  0421 History   First MD Initiated Contact with Patient 09/17/13 0459     Chief Complaint  Patient presents with  . Fall     (Consider location/radiation/quality/duration/timing/severity/associated sxs/prior Treatment) HPI 80 year old male presents to emergency room via EMS from his nursing facility after a fall.  Patient is reported to of rolled out of bed and struck his head on the night for table.  Patient is very hard of hearing with only one hearing aid.  He denies any pain at this time.  No LOC.  No blood thinners. Past Medical History  Diagnosis Date  . CAROTID ARTERY DISEASE 08/20/2009    doppler 2/10:  0-39% bilat ICA  / Doppler, September 21, 2010, stable, 0-39% bilateral  . Essential hypertension, benign 08/22/2008  . SINUS BRADYCARDIA 08/22/2008  . Edema 08/21/2009    Started January, 2011, probable mild volume overload and venous insufficiency  . Aortic stenosis     Mild, echo, March, 2011 / mild, echo, April, 2012  . Aortic insufficiency     Moderate, echo, March, 2011 / Moderate, echo, April, 2012  . Atherosclerosis     aorta; duplex 1/12: stable, no dissection, mid to distal irregular plaque with ulcerative appearance similar to prior study; severe stenosis IMA  . Aortic root dilation     Mild dilatation thoracic aorta  . Hemoptysis     Significant outpatient episode February 2 011, related to pneumonia by her  . Ejection fraction     60% echo March, 2011 / EF 60-65%,, September 21, 2010, echo  . Pneumonia     Right middle lobe, February, 2011 resolved  . Pre-syncope   . Varicose veins     Patient had a procedure related to the veins in his legs, 2012  . TIA (transient ischemic attack)     Possible TIA July, 2012, /  episode of staring April 04, 2011   History reviewed. No pertinent past surgical history. No family history on file. History  Substance Use Topics  . Smoking status: Former Smoker    Quit date: 06/03/1970   . Smokeless tobacco: Not on file  . Alcohol Use: Yes     Comment: pt recieve 1 beer daily    Review of Systems   See History of Present Illness; otherwise all other systems are reviewed and negative limited by patient, heart of hearing  Allergies  Review of patient's allergies indicates no known allergies.  Home Medications   Prior to Admission medications   Medication Sig Start Date End Date Taking? Authorizing Provider  ALPRAZolam Prudy Feeler(XANAX) 0.5 MG tablet Take 0.5 mg by mouth every 6 (six) hours as needed for anxiety or sleep.    Yes Historical Provider, MD  aspirin 81 MG tablet Take 81 mg by mouth daily.     Yes Historical Provider, MD  haloperidol (HALDOL) 0.5 MG tablet Take 0.5 mg by mouth at bedtime. 06/23/13  Yes Historical Provider, MD  hydrochlorothiazide 25 MG tablet Take 25 mg by mouth daily.     Yes Historical Provider, MD  mometasone (ELOCON) 0.1 % cream Apply 1 application topically daily. Apply to all areas of the body except face once daily.   Yes Historical Provider, MD  quinapril (ACCUPRIL) 40 MG tablet Take 40 mg by mouth daily.   Yes Historical Provider, MD  traZODone (DESYREL) 50 MG tablet Take 50 mg by mouth at bedtime. At 8PM. 06/23/13  Yes Historical Provider, MD  furosemide (LASIX) 20 MG tablet Take 20 mg by mouth every Monday, Wednesday, and Friday.    Historical Provider, MD   BP 171/52  Pulse 68  Temp(Src) 98 F (36.7 C) (Oral)  Resp 18  SpO2 99% Physical Exam  Nursing note and vitals reviewed. Constitutional: He is oriented to person, place, and time. He appears well-developed and well-nourished. No distress.  HENT:  Head: Normocephalic.  Right Ear: External ear normal.  Left Ear: External ear normal.  Nose: Nose normal.  Mouth/Throat: Oropharynx is clear and moist.  3 cm laceration of scalp to left parietal scalp  Eyes: Conjunctivae and EOM are normal. Pupils are equal, round, and reactive to light.  Neck: Normal range of motion. Neck supple. No  JVD present. No tracheal deviation present. No thyromegaly present.  Cardiovascular: Normal rate, regular rhythm and intact distal pulses.  Exam reveals no gallop and no friction rub.   Murmur heard. Pulmonary/Chest: Effort normal and breath sounds normal. No stridor. No respiratory distress. He has no wheezes. He has no rales. He exhibits no tenderness.  Abdominal: Soft. Bowel sounds are normal. He exhibits no distension and no mass. There is no tenderness. There is no rebound and no guarding.  Musculoskeletal: Normal range of motion. He exhibits no edema and no tenderness.  Lymphadenopathy:    He has no cervical adenopathy.  Neurological: He is alert and oriented to person, place, and time. He has normal reflexes. No cranial nerve deficit. He exhibits normal muscle tone. Coordination normal.  Skin: Skin is warm and dry. No rash noted. No erythema. No pallor.  Psychiatric: He has a normal mood and affect. His behavior is normal. Judgment and thought content normal.    ED Course  Procedures (including critical care time) Labs Review Labs Reviewed - No data to display  Imaging Review Ct Head Wo Contrast  09/17/2013   CLINICAL DATA:  Fall, head trauma  EXAM: CT HEAD WITHOUT CONTRAST  CT CERVICAL SPINE WITHOUT CONTRAST  TECHNIQUE: Multidetector CT imaging of the head and cervical spine was performed following the standard protocol without intravenous contrast. Multiplanar CT image reconstructions of the cervical spine were also generated.  COMPARISON:  None.  FINDINGS: CT HEAD FINDINGS  There is no evidence of mass effect, midline shift, or extra-axial fluid collections. There is no evidence of a space-occupying lesion or intracranial hemorrhage. There is no evidence of a cortical-based area of acute infarction. There is generalized cerebral atrophy. There is periventricular white matter low attenuation likely secondary to microangiopathy.  The ventricles and sulci are appropriate for the patient's  age. The basal cisterns are patent.  Visualized portions of the orbits are unremarkable. The visualized portions of the paranasal sinuses and mastoid air cells are unremarkable.  The osseous structures are unremarkable.  CT CERVICAL SPINE FINDINGS  The alignment is anatomic. The vertebral body heights are maintained. There is no acute fracture. There is no static listhesis. The prevertebral soft tissues are normal. The intraspinal soft tissues are not fully imaged on this examination due to poor soft tissue contrast, but there is no gross soft tissue abnormality.  There is mild degenerative disc disease at C5-6. The remainder the disc spaces are maintained.  The visualized portions of the lung apices demonstrate no focal abnormality.  IMPRESSION: 1. No acute intracranial pathology. 2. No acute osseous injury of the cervical spine.   Electronically Signed   By: Elige KoHetal  Patel   On: 09/17/2013 05:54   Ct Cervical Spine Wo Contrast  09/17/2013   CLINICAL DATA:  Fall, head trauma  EXAM: CT HEAD WITHOUT CONTRAST  CT CERVICAL SPINE WITHOUT CONTRAST  TECHNIQUE: Multidetector CT imaging of the head and cervical spine was performed following the standard protocol without intravenous contrast. Multiplanar CT image reconstructions of the cervical spine were also generated.  COMPARISON:  None.  FINDINGS: CT HEAD FINDINGS  There is no evidence of mass effect, midline shift, or extra-axial fluid collections. There is no evidence of a space-occupying lesion or intracranial hemorrhage. There is no evidence of a cortical-based area of acute infarction. There is generalized cerebral atrophy. There is periventricular white matter low attenuation likely secondary to microangiopathy.  The ventricles and sulci are appropriate for the patient's age. The basal cisterns are patent.  Visualized portions of the orbits are unremarkable. The visualized portions of the paranasal sinuses and mastoid air cells are unremarkable.  The osseous  structures are unremarkable.  CT CERVICAL SPINE FINDINGS  The alignment is anatomic. The vertebral body heights are maintained. There is no acute fracture. There is no static listhesis. The prevertebral soft tissues are normal. The intraspinal soft tissues are not fully imaged on this examination due to poor soft tissue contrast, but there is no gross soft tissue abnormality.  There is mild degenerative disc disease at C5-6. The remainder the disc spaces are maintained.  The visualized portions of the lung apices demonstrate no focal abnormality.  IMPRESSION: 1. No acute intracranial pathology. 2. No acute osseous injury of the cervical spine.   Electronically Signed   By: Elige Ko   On: 09/17/2013 05:54    LACERATION REPAIR Performed by: Olivia Mackie Authorized by: Olivia Mackie Consent: Verbal consent obtained. Risks and benefits: risks, benefits and alternatives were discussed Consent given by: patient Patient identity confirmed: provided demographic data Prepped and Draped in normal sterile fashion Wound explored  Laceration Location: left parietal scalp  Laceration Length: 3cm  No Foreign Bodies seen or palpated  Anesthesia: local infiltration  Local anesthetic: none  Anesthetic total: 0 ml  Irrigation method: syringe Amount of cleaning: standard  Skin closure: surgical staples  Number of sutures: 3  Technique: staples  Patient tolerance: Patient tolerated the procedure well with no immediate complications.   EKG Interpretation None      MDM   Final diagnoses:  Fall at nursing home  Scalp laceration    80 year old male status post fall at his nursing facility with scalp laceration.  CT scans without acute traumatic findings.  Scalp laceration, repaired with staples.  Removal in 7 days.    Olivia Mackie, MD 09/17/13 [phone]

## 2013-11-01 ENCOUNTER — Encounter: Payer: Self-pay | Admitting: Cardiology

## 2014-05-09 ENCOUNTER — Emergency Department (HOSPITAL_COMMUNITY): Payer: Medicare Other

## 2014-05-09 ENCOUNTER — Emergency Department (HOSPITAL_COMMUNITY)
Admission: EM | Admit: 2014-05-09 | Discharge: 2014-05-09 | Disposition: A | Discharge status: Home / Self Care | Payer: Medicare Other | Attending: Emergency Medicine | Admitting: Emergency Medicine

## 2014-05-09 ENCOUNTER — Encounter (HOSPITAL_COMMUNITY): Payer: Self-pay | Admitting: Emergency Medicine

## 2014-05-09 DIAGNOSIS — Y998 Other external cause status: Secondary | ICD-10-CM | POA: Insufficient documentation

## 2014-05-09 DIAGNOSIS — Z7982 Long term (current) use of aspirin: Secondary | ICD-10-CM | POA: Diagnosis not present

## 2014-05-09 DIAGNOSIS — Z8701 Personal history of pneumonia (recurrent): Secondary | ICD-10-CM | POA: Diagnosis not present

## 2014-05-09 DIAGNOSIS — W19XXXA Unspecified fall, initial encounter: Secondary | ICD-10-CM

## 2014-05-09 DIAGNOSIS — Z8673 Personal history of transient ischemic attack (TIA), and cerebral infarction without residual deficits: Secondary | ICD-10-CM | POA: Diagnosis not present

## 2014-05-09 DIAGNOSIS — Y9389 Activity, other specified: Secondary | ICD-10-CM | POA: Insufficient documentation

## 2014-05-09 DIAGNOSIS — Z79899 Other long term (current) drug therapy: Secondary | ICD-10-CM | POA: Diagnosis not present

## 2014-05-09 DIAGNOSIS — S0101XA Laceration without foreign body of scalp, initial encounter: Secondary | ICD-10-CM | POA: Diagnosis not present

## 2014-05-09 DIAGNOSIS — R509 Fever, unspecified: Secondary | ICD-10-CM

## 2014-05-09 DIAGNOSIS — I10 Essential (primary) hypertension: Secondary | ICD-10-CM | POA: Insufficient documentation

## 2014-05-09 DIAGNOSIS — Z87891 Personal history of nicotine dependence: Secondary | ICD-10-CM | POA: Diagnosis not present

## 2014-05-09 DIAGNOSIS — Y92128 Other place in nursing home as the place of occurrence of the external cause: Secondary | ICD-10-CM | POA: Diagnosis not present

## 2014-05-09 DIAGNOSIS — W01198A Fall on same level from slipping, tripping and stumbling with subsequent striking against other object, initial encounter: Secondary | ICD-10-CM | POA: Insufficient documentation

## 2014-05-09 DIAGNOSIS — R52 Pain, unspecified: Secondary | ICD-10-CM

## 2014-05-09 LAB — URINALYSIS, ROUTINE W REFLEX MICROSCOPIC
BILIRUBIN URINE: NEGATIVE
GLUCOSE, UA: NEGATIVE mg/dL
Ketones, ur: NEGATIVE mg/dL
Leukocytes, UA: NEGATIVE
Nitrite: NEGATIVE
PH: 5.5 (ref 5.0–8.0)
Protein, ur: NEGATIVE mg/dL
SPECIFIC GRAVITY, URINE: 1.019 (ref 1.005–1.030)
Urobilinogen, UA: 0.2 mg/dL (ref 0.0–1.0)

## 2014-05-09 LAB — CBC WITH DIFFERENTIAL/PLATELET
BASOS PCT: 0 % (ref 0–1)
Basophils Absolute: 0 10*3/uL (ref 0.0–0.1)
EOS ABS: 0.2 10*3/uL (ref 0.0–0.7)
Eosinophils Relative: 3 % (ref 0–5)
HEMATOCRIT: 36.7 % — AB (ref 39.0–52.0)
HEMOGLOBIN: 12.1 g/dL — AB (ref 13.0–17.0)
Lymphocytes Relative: 9 % — ABNORMAL LOW (ref 12–46)
Lymphs Abs: 0.8 10*3/uL (ref 0.7–4.0)
MCH: 30.1 pg (ref 26.0–34.0)
MCHC: 33 g/dL (ref 30.0–36.0)
MCV: 91.3 fL (ref 78.0–100.0)
MONO ABS: 0.6 10*3/uL (ref 0.1–1.0)
MONOS PCT: 7 % (ref 3–12)
Neutro Abs: 7.1 10*3/uL (ref 1.7–7.7)
Neutrophils Relative %: 82 % — ABNORMAL HIGH (ref 43–77)
Platelets: 237 10*3/uL (ref 150–400)
RBC: 4.02 MIL/uL — ABNORMAL LOW (ref 4.22–5.81)
RDW: 13.6 % (ref 11.5–15.5)
WBC: 8.7 10*3/uL (ref 4.0–10.5)

## 2014-05-09 LAB — I-STAT CHEM 8, ED
BUN: 27 mg/dL — ABNORMAL HIGH (ref 6–23)
CALCIUM ION: 1.21 mmol/L (ref 1.13–1.30)
Chloride: 101 mEq/L (ref 96–112)
Creatinine, Ser: 1.1 mg/dL (ref 0.50–1.35)
Glucose, Bld: 128 mg/dL — ABNORMAL HIGH (ref 70–99)
HEMATOCRIT: 44 % (ref 39.0–52.0)
Hemoglobin: 15 g/dL (ref 13.0–17.0)
Potassium: 4.3 mEq/L (ref 3.7–5.3)
Sodium: 137 mEq/L (ref 137–147)
TCO2: 16 mmol/L (ref 0–100)

## 2014-05-09 LAB — URINE MICROSCOPIC-ADD ON

## 2014-05-09 LAB — LACTIC ACID, PLASMA: Lactic Acid, Venous: 2.1 mmol/L (ref 0.5–2.2)

## 2014-05-09 MED ORDER — ACETAMINOPHEN 650 MG RE SUPP
650.0000 mg | Freq: Once | RECTAL | Status: AC
  Administered 2014-05-09: 650 mg via RECTAL
  Filled 2014-05-09: qty 1

## 2014-05-09 MED ORDER — IBUPROFEN 100 MG/5ML PO SUSP
600.0000 mg | Freq: Once | ORAL | Status: AC
  Administered 2014-05-09: 600 mg via ORAL
  Filled 2014-05-09: qty 30

## 2014-05-09 NOTE — Progress Notes (Signed)
CSW met with patient at bedside. Pt was uncommunicative. According to patients step son he cannot hear well and wears a hearing aid. Per step son, Pt lives at [facility] and fell there today. Patient appears to have a head wound, which was bleeding.  Per step-son, The patients physician is Dr.Dick Rosanna Randy [phone] who is also the patients step-son.  Willette Brace [phone] ED CSW 05/09/2014 8:34 PM

## 2014-05-09 NOTE — ED Notes (Signed)
Bed: WA06 Expected date:  Expected time:  Means of arrival:  Comments: EMS Fall

## 2014-05-09 NOTE — ED Provider Notes (Addendum)
CSN: [id number]     Arrival date & time 05/09/14  1646 History   First MD Initiated Contact with Patient 05/09/14 1714     Chief Complaint  Patient presents with  . Fall  . Head Laceration     Level V caveat: Progressive memory loss  HPI Patient is brought to the emergency department by his son after a fall at the nursing facility/memory care unit today.  He's had increasing falls over the past several days.  This results in a small laceration of his posterior scalp.  He is not on any anticoagulants.  Family reports no altered mental status.  No recent reports of vomiting or diarrhea.  The patient's son is a primary care physician in [facility] [place] [place] and cares for his father.  He reports that he would prefer that no imaging of his head be performed as there would be no plan for intervention based on the findings.  The patient's son reports that he is more focused on quality of life and less quantity.  In our discussions it sounds as though the patient's family members would prefer more of a minimal workup in the emergency department.  Patient was found to have a fever of 101 rectally on examination.   Past Medical History  Diagnosis Date  . CAROTID ARTERY DISEASE 08/20/2009    doppler 2/10:  0-39% bilat ICA  / Doppler, September 21, 2010, stable, 0-39% bilateral  . Essential hypertension, benign 08/22/2008  . SINUS BRADYCARDIA 08/22/2008  . Edema 08/21/2009    Started January, 2011, probable mild volume overload and venous insufficiency  . Aortic stenosis     Mild, echo, March, 2011 / mild, echo, April, 2012  . Aortic insufficiency     Moderate, echo, March, 2011 / Moderate, echo, April, 2012  . Atherosclerosis     aorta; duplex 1/12: stable, no dissection, mid to distal irregular plaque with ulcerative appearance similar to prior study; severe stenosis IMA  . Aortic root dilation     Mild dilatation thoracic aorta  . Hemoptysis     Significant outpatient episode February 2 011,  related to pneumonia by her  . Ejection fraction     60% echo March, 2011 / EF 60-65%,, September 21, 2010, echo  . Pneumonia     Right middle lobe, February, 2011 resolved  . Pre-syncope   . Varicose veins     Patient had a procedure related to the veins in his legs, 2012  . TIA (transient ischemic attack)     Possible TIA July, 2012, /  episode of staring April 04, 2011   History reviewed. No pertinent past surgical history. No family history on file. History  Substance Use Topics  . Smoking status: Former Smoker    Quit date: 06/03/1970  . Smokeless tobacco: Not on file  . Alcohol Use: Yes     Comment: pt recieve 1 beer daily    Review of Systems  Unable to perform ROS     Allergies  Review of patient's allergies indicates no known allergies.  Home Medications   Prior to Admission medications   Medication Sig Start Date End Date Taking? Authorizing Provider  ALPRAZolam Prudy Feeler(XANAX) 0.5 MG tablet Take 0.5 mg by mouth every 6 (six) hours as needed for anxiety or sleep (anxiety).    Yes Historical Provider, MD  aspirin 81 MG tablet Take 81 mg by mouth daily.     Yes Historical Provider, MD  furosemide (LASIX) 20 MG tablet Take 20  mg by mouth every Monday, Wednesday, and Friday.   Yes Historical Provider, MD  hydrochlorothiazide 25 MG tablet Take 25 mg by mouth daily.     Yes Historical Provider, MD  loratadine (CLARITIN) 10 MG tablet Take 10 mg by mouth daily.   Yes Historical Provider, MD   BP 145/55 mmHg  Pulse 123  Temp(Src) 101.6 F (38.7 C) (Rectal)  Resp 18  SpO2 89% Physical Exam  Constitutional: He appears well-developed.  HENT:  Head: Normocephalic.  2 cm posterior scalp laceration with a small amount of bleeding.  No associated hematoma  Eyes: EOM are normal. Pupils are equal, round, and reactive to light.  Neck: Normal range of motion. Neck supple.  No cervical tenderness or step-offs  Cardiovascular: Normal rate, regular rhythm, normal heart sounds and  intact distal pulses.   Pulmonary/Chest: Effort normal and breath sounds normal. No respiratory distress.  Abdominal: Soft. He exhibits no distension. There is no tenderness.  Musculoskeletal: Normal range of motion.  Grossly normal strength in his arms and legs.  Full range of motion of his bilateral shoulders elbows and wrists.  Neurological: He is alert.  Follow simple commands.  Able to bear weight on his bilateral lower extremities.  Skin: Skin is warm and dry.  Psychiatric: He has a normal mood and affect. Judgment normal.  Nursing note and vitals reviewed.   ED Course  Procedures   LACERATION REPAIR Performed by: Lyanne CoAMPOS,Martine Trageser M Consent: Verbal consent obtained. Risks and benefits: risks, benefits and alternatives were discussed Patient identity confirmed: provided demographic data Time out performed prior to procedure Prepped and Draped in normal sterile fashion Wound explored Laceration Location: posterior right scalp Laceration Length: 2cm No Foreign Bodies seen or palpated Anesthesia: none Amount of cleaning: standard Skin closure: staple Number of sutures or staples: 3 Technique: staple Patient tolerance: Patient tolerated the procedure well with no immediate complications.   Labs Review Labs Reviewed  URINALYSIS, ROUTINE W REFLEX MICROSCOPIC - Abnormal; Notable for the following:    Hgb urine dipstick MODERATE (*)    All other components within normal limits  CBC WITH DIFFERENTIAL - Abnormal; Notable for the following:    RBC 4.02 (*)    Hemoglobin 12.1 (*)    HCT 36.7 (*)    Neutrophils Relative % 82 (*)    Lymphocytes Relative 9 (*)    All other components within normal limits  I-STAT CHEM 8, ED - Abnormal; Notable for the following:    BUN 27 (*)    Glucose, Bld 128 (*)    All other components within normal limits  URINE CULTURE  CULTURE, BLOOD (ROUTINE X 2)  CULTURE, BLOOD (ROUTINE X 2)  URINE MICROSCOPIC-ADD ON  LACTIC ACID, PLASMA    Imaging  Review Dg Chest 2 View  05/09/2014   CLINICAL DATA:  Fever  EXAM: CHEST  2 VIEW  COMPARISON:  07/01/2013  FINDINGS: Cardiac shadow is stable. Bibasilar atelectasis is noted. Mild interstitial changes are again seen. No acute bony abnormality is noted. Chronic compression deformities are noted in the thoracic spine.  IMPRESSION: Bibasilar atelectatic changes   Electronically Signed   By: Alcide CleverMark  Lukens M.D.   On: 05/09/2014 20:26     EKG Interpretation None      MDM   Final diagnoses:  Fall  Pain    Patient found to have fever in the emergency department.  Chest x-ray without pneumonia.  Urinalysis shows no signs of infection.  White blood cell count is normal.  It sounds though there is been some upper respiratory symptoms going around the nursing facility.  I spoke with the patient's sons including the physician and the family who helps care for his father.  They will continue to follow-up the patient closely.  They are agreeable to no inpatient workup and conservative measures and there elderly father with advanced medical issues.    Lyanne Co, MD 05/09/14 2440  Lyanne Co, MD 05/09/14 2214

## 2014-05-09 NOTE — Discharge Instructions (Signed)
Laceration Care, Adult °A laceration is a cut or lesion that goes through all layers of the skin and into the tissue just beneath the skin. °TREATMENT  °Some lacerations may not require closure. Some lacerations may not be able to be closed due to an increased risk of infection. It is important to see your caregiver as soon as possible after an injury to minimize the risk of infection and maximize the opportunity for successful closure. °If closure is appropriate, pain medicines may be given, if needed. The wound will be cleaned to help prevent infection. Your caregiver will use stitches (sutures), staples, wound glue (adhesive), or skin adhesive strips to repair the laceration. These tools bring the skin edges together to allow for faster healing and a better cosmetic outcome. However, all wounds will heal with a scar. Once the wound has healed, scarring can be minimized by covering the wound with sunscreen during the day for 1 full year. °HOME CARE INSTRUCTIONS  °For sutures or staples: °· Keep the wound clean and dry. °· If you were given a bandage (dressing), you should change it at least once a day. Also, change the dressing if it becomes wet or dirty, or as directed by your caregiver. °· Wash the wound with soap and water 2 times a day. Rinse the wound off with water to remove all soap. Pat the wound dry with a clean towel. °· After cleaning, apply a thin layer of the antibiotic ointment as recommended by your caregiver. This will help prevent infection and keep the dressing from sticking. °· You may shower as usual after the first 24 hours. Do not soak the wound in water until the sutures are removed. °· Only take over-the-counter or prescription medicines for pain, discomfort, or fever as directed by your caregiver. °· Get your sutures or staples removed as directed by your caregiver. °For skin adhesive strips: °· Keep the wound clean and dry. °· Do not get the skin adhesive strips wet. You may bathe  carefully, using caution to keep the wound dry. °· If the wound gets wet, pat it dry with a clean towel. °· Skin adhesive strips will fall off on their own. You may trim the strips as the wound heals. Do not remove skin adhesive strips that are still stuck to the wound. They will fall off in time. °For wound adhesive: °· You may briefly wet your wound in the shower or bath. Do not soak or scrub the wound. Do not swim. Avoid periods of heavy perspiration until the skin adhesive has fallen off on its own. After showering or bathing, gently pat the wound dry with a clean towel. °· Do not apply liquid medicine, cream medicine, or ointment medicine to your wound while the skin adhesive is in place. This may loosen the film before your wound is healed. °· If a dressing is placed over the wound, be careful not to apply tape directly over the skin adhesive. This may cause the adhesive to be pulled off before the wound is healed. °· Avoid prolonged exposure to sunlight or tanning lamps while the skin adhesive is in place. Exposure to ultraviolet light in the first year will darken the scar. °· The skin adhesive will usually remain in place for 5 to 10 days, then naturally fall off the skin. Do not pick at the adhesive film. °You may need a tetanus shot if: °· You cannot remember when you had your last tetanus shot. °· You have never had a tetanus   shot. If you get a tetanus shot, your arm may swell, get red, and feel warm to the touch. This is common and not a problem. If you need a tetanus shot and you choose not to have one, there is a rare chance of getting tetanus. Sickness from tetanus can be serious. SEEK MEDICAL CARE IF:   You have redness, swelling, or increasing pain in the wound.  You see a red line that goes away from the wound.  You have yellowish-white fluid (pus) coming from the wound.  You have a fever.  You notice a bad smell coming from the wound or dressing.  Your wound breaks open before or  after sutures have been removed.  You notice something coming out of the wound such as wood or glass.  Your wound is on your hand or foot and you cannot move a finger or toe. SEEK IMMEDIATE MEDICAL CARE IF:   Your pain is not controlled with prescribed medicine.  You have severe swelling around the wound causing pain and numbness or a change in color in your arm, hand, leg, or foot.  Your wound splits open and starts bleeding.  You have worsening numbness, weakness, or loss of function of any joint around or beyond the wound.  You develop painful lumps near the wound or on the skin anywhere on your body. MAKE SURE YOU:   Understand these instructions.  Will watch your condition.  Will get help right away if you are not doing well or get worse. Document Released: 05/20/2005 Document Revised: 08/12/2011 Document Reviewed: 11/13/2010 [facility] Patient Information 2015 [employer] [place]. This information is not intended to replace advice given to you by your health care provider. Make sure you discuss any questions you have with your health care provider. Fever, Adult A fever is a higher than normal body temperature. In an adult, an oral temperature around 98.6 F (37 C) is considered normal. A temperature of 100.4 F (38 C) or higher is generally considered a fever. Mild or moderate fevers generally have no long-term effects and often do not require treatment. Extreme fever (greater than or equal to 106 F or 41.1 C) can cause seizures. The sweating that may occur with repeated or prolonged fever may cause dehydration. Elderly people can develop confusion during a fever. A measured temperature can vary with:  Age.  Time of day.  Method of measurement (mouth, underarm, rectal, or ear). The fever is confirmed by taking a temperature with a thermometer. Temperatures can be taken different ways. Some methods are accurate and some are not.  An oral temperature is used most commonly.  Electronic thermometers are fast and accurate.  An ear temperature will only be accurate if the thermometer is positioned as recommended by the manufacturer.  A rectal temperature is accurate and done for those adults who have a condition where an oral temperature cannot be taken.  An underarm (axillary) temperature is not accurate and not recommended. Fever is a symptom, not a disease.  CAUSES   Infections commonly cause fever.  Some noninfectious causes for fever include:  Some arthritis conditions.  Some thyroid or adrenal gland conditions.  Some immune system conditions.  Some types of cancer.  A medicine reaction.  High doses of certain street drugs such as methamphetamine.  Dehydration.  Exposure to high outside or room temperatures.  Occasionally, the source of a fever cannot be determined. This is sometimes called a "fever of unknown origin" (FUO).  Some situations may lead to  a temporary rise in body temperature that may go away on its own. Examples are:  Childbirth.  Surgery.  Intense exercise. HOME CARE INSTRUCTIONS   Take appropriate medicines for fever. Follow dosing instructions carefully. If you use acetaminophen to reduce the fever, be careful to avoid taking other medicines that also contain acetaminophen. Do not take aspirin for a fever if you are younger than age 78. There is an association with Reye's syndrome. Reye's syndrome is a rare but potentially deadly disease.  If an infection is present and antibiotics have been prescribed, take them as directed. Finish them even if you start to feel better.  Rest as needed.  Maintain an adequate fluid intake. To prevent dehydration during an illness with prolonged or recurrent fever, you may need to drink extra fluid.Drink enough fluids to keep your urine clear or pale yellow.  Sponging or bathing with room temperature water may help reduce body temperature. Do not use ice water or alcohol sponge  baths.  Dress comfortably, but do not over-bundle. SEEK MEDICAL CARE IF:   You are unable to keep fluids down.  You develop vomiting or diarrhea.  You are not feeling at least partly better after 3 days.  You develop new symptoms or problems. SEEK IMMEDIATE MEDICAL CARE IF:   You have shortness of breath or trouble breathing.  You develop excessive weakness.  You are dizzy or you faint.  You are extremely thirsty or you are making little or no urine.  You develop new pain that was not there before (such as in the head, neck, chest, back, or abdomen).  You have persistent vomiting and diarrhea for more than 1 to 2 days.  You develop a stiff neck or your eyes become sensitive to light.  You develop a skin rash.  You have a fever or persistent symptoms for more than 2 to 3 days.  You have a fever and your symptoms suddenly get worse. MAKE SURE YOU:   Understand these instructions.  Will watch your condition.  Will get help right away if you are not doing well or get worse. Document Released: 11/13/2000 Document Revised: 10/04/2013 Document Reviewed: 03/21/2011 [facility] Patient Information 2015 [employer] [place], [place]. This information is not intended to replace advice given to you by your health care provider. Make sure you discuss any questions you have with your health care provider.

## 2014-05-09 NOTE — ED Notes (Addendum)
Per EMS: [facility] from [facility]. Pt had unwitnessed fall, believed to have hit head on the arm of a wooden chair. Pt has small laceration to R posterior head, small abrasion to L posterior head. Pt alert but nonverbal; this is his baseline d/t severe alzheimer's dementia. Pt not on blood thinners. No deformities or abrasions along spine, no signs of pain with palpation. Able to follow commands, equal bilateral grip strength.

## 2014-05-10 LAB — URINE CULTURE
COLONY COUNT: NO GROWTH
CULTURE: NO GROWTH

## 2014-05-16 LAB — CULTURE, BLOOD (ROUTINE X 2)
Culture: NO GROWTH
Culture: NO GROWTH

## 2014-06-06 DIAGNOSIS — M159 Polyosteoarthritis, unspecified: Secondary | ICD-10-CM | POA: Diagnosis not present

## 2014-06-06 DIAGNOSIS — F0280 Dementia in other diseases classified elsewhere without behavioral disturbance: Secondary | ICD-10-CM | POA: Diagnosis not present

## 2014-06-06 DIAGNOSIS — G309 Alzheimer's disease, unspecified: Secondary | ICD-10-CM | POA: Diagnosis not present

## 2014-06-06 DIAGNOSIS — M6281 Muscle weakness (generalized): Secondary | ICD-10-CM | POA: Diagnosis not present

## 2014-06-06 DIAGNOSIS — R2689 Other abnormalities of gait and mobility: Secondary | ICD-10-CM | POA: Diagnosis not present

## 2014-06-06 DIAGNOSIS — I10 Essential (primary) hypertension: Secondary | ICD-10-CM | POA: Diagnosis not present

## 2014-06-09 DIAGNOSIS — R2689 Other abnormalities of gait and mobility: Secondary | ICD-10-CM | POA: Diagnosis not present

## 2014-06-09 DIAGNOSIS — M159 Polyosteoarthritis, unspecified: Secondary | ICD-10-CM | POA: Diagnosis not present

## 2014-06-09 DIAGNOSIS — M6281 Muscle weakness (generalized): Secondary | ICD-10-CM | POA: Diagnosis not present

## 2014-06-09 DIAGNOSIS — G309 Alzheimer's disease, unspecified: Secondary | ICD-10-CM | POA: Diagnosis not present

## 2014-06-09 DIAGNOSIS — I10 Essential (primary) hypertension: Secondary | ICD-10-CM | POA: Diagnosis not present

## 2014-06-09 DIAGNOSIS — F0280 Dementia in other diseases classified elsewhere without behavioral disturbance: Secondary | ICD-10-CM | POA: Diagnosis not present

## 2014-06-10 DIAGNOSIS — R269 Unspecified abnormalities of gait and mobility: Secondary | ICD-10-CM | POA: Diagnosis not present

## 2014-06-10 DIAGNOSIS — I10 Essential (primary) hypertension: Secondary | ICD-10-CM | POA: Diagnosis not present

## 2014-06-10 DIAGNOSIS — G301 Alzheimer's disease with late onset: Secondary | ICD-10-CM | POA: Diagnosis not present

## 2014-06-13 DIAGNOSIS — R2689 Other abnormalities of gait and mobility: Secondary | ICD-10-CM | POA: Diagnosis not present

## 2014-06-13 DIAGNOSIS — F0280 Dementia in other diseases classified elsewhere without behavioral disturbance: Secondary | ICD-10-CM | POA: Diagnosis not present

## 2014-06-13 DIAGNOSIS — M6281 Muscle weakness (generalized): Secondary | ICD-10-CM | POA: Diagnosis not present

## 2014-06-13 DIAGNOSIS — I10 Essential (primary) hypertension: Secondary | ICD-10-CM | POA: Diagnosis not present

## 2014-06-13 DIAGNOSIS — G309 Alzheimer's disease, unspecified: Secondary | ICD-10-CM | POA: Diagnosis not present

## 2014-06-13 DIAGNOSIS — M159 Polyosteoarthritis, unspecified: Secondary | ICD-10-CM | POA: Diagnosis not present

## 2014-06-14 DIAGNOSIS — G301 Alzheimer's disease with late onset: Secondary | ICD-10-CM | POA: Diagnosis not present

## 2014-06-14 DIAGNOSIS — F411 Generalized anxiety disorder: Secondary | ICD-10-CM | POA: Diagnosis not present

## 2014-06-15 DIAGNOSIS — R2689 Other abnormalities of gait and mobility: Secondary | ICD-10-CM | POA: Diagnosis not present

## 2014-06-15 DIAGNOSIS — I10 Essential (primary) hypertension: Secondary | ICD-10-CM | POA: Diagnosis not present

## 2014-06-15 DIAGNOSIS — M6281 Muscle weakness (generalized): Secondary | ICD-10-CM | POA: Diagnosis not present

## 2014-06-15 DIAGNOSIS — M159 Polyosteoarthritis, unspecified: Secondary | ICD-10-CM | POA: Diagnosis not present

## 2014-06-15 DIAGNOSIS — F0280 Dementia in other diseases classified elsewhere without behavioral disturbance: Secondary | ICD-10-CM | POA: Diagnosis not present

## 2014-06-15 DIAGNOSIS — G309 Alzheimer's disease, unspecified: Secondary | ICD-10-CM | POA: Diagnosis not present

## 2014-06-20 DIAGNOSIS — G309 Alzheimer's disease, unspecified: Secondary | ICD-10-CM | POA: Diagnosis not present

## 2014-06-20 DIAGNOSIS — M6281 Muscle weakness (generalized): Secondary | ICD-10-CM | POA: Diagnosis not present

## 2014-06-20 DIAGNOSIS — R2689 Other abnormalities of gait and mobility: Secondary | ICD-10-CM | POA: Diagnosis not present

## 2014-06-20 DIAGNOSIS — M159 Polyosteoarthritis, unspecified: Secondary | ICD-10-CM | POA: Diagnosis not present

## 2014-06-20 DIAGNOSIS — I10 Essential (primary) hypertension: Secondary | ICD-10-CM | POA: Diagnosis not present

## 2014-06-20 DIAGNOSIS — F0280 Dementia in other diseases classified elsewhere without behavioral disturbance: Secondary | ICD-10-CM | POA: Diagnosis not present

## 2014-06-21 DIAGNOSIS — J309 Allergic rhinitis, unspecified: Secondary | ICD-10-CM | POA: Diagnosis not present

## 2014-06-21 DIAGNOSIS — R600 Localized edema: Secondary | ICD-10-CM | POA: Diagnosis not present

## 2014-06-21 DIAGNOSIS — L853 Xerosis cutis: Secondary | ICD-10-CM | POA: Diagnosis not present

## 2014-06-21 DIAGNOSIS — K5900 Constipation, unspecified: Secondary | ICD-10-CM | POA: Diagnosis not present

## 2014-06-21 DIAGNOSIS — I10 Essential (primary) hypertension: Secondary | ICD-10-CM | POA: Diagnosis not present

## 2014-06-22 DIAGNOSIS — R2689 Other abnormalities of gait and mobility: Secondary | ICD-10-CM | POA: Diagnosis not present

## 2014-06-22 DIAGNOSIS — M159 Polyosteoarthritis, unspecified: Secondary | ICD-10-CM | POA: Diagnosis not present

## 2014-06-22 DIAGNOSIS — M6281 Muscle weakness (generalized): Secondary | ICD-10-CM | POA: Diagnosis not present

## 2014-06-23 DIAGNOSIS — G309 Alzheimer's disease, unspecified: Secondary | ICD-10-CM | POA: Diagnosis not present

## 2014-06-23 DIAGNOSIS — M6281 Muscle weakness (generalized): Secondary | ICD-10-CM | POA: Diagnosis not present

## 2014-06-23 DIAGNOSIS — F0280 Dementia in other diseases classified elsewhere without behavioral disturbance: Secondary | ICD-10-CM | POA: Diagnosis not present

## 2014-06-23 DIAGNOSIS — M159 Polyosteoarthritis, unspecified: Secondary | ICD-10-CM | POA: Diagnosis not present

## 2014-06-23 DIAGNOSIS — R2689 Other abnormalities of gait and mobility: Secondary | ICD-10-CM | POA: Diagnosis not present

## 2014-06-23 DIAGNOSIS — I10 Essential (primary) hypertension: Secondary | ICD-10-CM | POA: Diagnosis not present

## 2014-06-27 DIAGNOSIS — M159 Polyosteoarthritis, unspecified: Secondary | ICD-10-CM | POA: Diagnosis not present

## 2014-06-27 DIAGNOSIS — G309 Alzheimer's disease, unspecified: Secondary | ICD-10-CM | POA: Diagnosis not present

## 2014-06-27 DIAGNOSIS — I10 Essential (primary) hypertension: Secondary | ICD-10-CM | POA: Diagnosis not present

## 2014-06-27 DIAGNOSIS — M6281 Muscle weakness (generalized): Secondary | ICD-10-CM | POA: Diagnosis not present

## 2014-06-27 DIAGNOSIS — F0280 Dementia in other diseases classified elsewhere without behavioral disturbance: Secondary | ICD-10-CM | POA: Diagnosis not present

## 2014-06-27 DIAGNOSIS — R2689 Other abnormalities of gait and mobility: Secondary | ICD-10-CM | POA: Diagnosis not present

## 2014-06-30 DIAGNOSIS — R2689 Other abnormalities of gait and mobility: Secondary | ICD-10-CM | POA: Diagnosis not present

## 2014-06-30 DIAGNOSIS — F0280 Dementia in other diseases classified elsewhere without behavioral disturbance: Secondary | ICD-10-CM | POA: Diagnosis not present

## 2014-06-30 DIAGNOSIS — M6281 Muscle weakness (generalized): Secondary | ICD-10-CM | POA: Diagnosis not present

## 2014-06-30 DIAGNOSIS — G309 Alzheimer's disease, unspecified: Secondary | ICD-10-CM | POA: Diagnosis not present

## 2014-06-30 DIAGNOSIS — I10 Essential (primary) hypertension: Secondary | ICD-10-CM | POA: Diagnosis not present

## 2014-06-30 DIAGNOSIS — M159 Polyosteoarthritis, unspecified: Secondary | ICD-10-CM | POA: Diagnosis not present

## 2014-07-01 DIAGNOSIS — E782 Mixed hyperlipidemia: Secondary | ICD-10-CM | POA: Diagnosis not present

## 2014-07-01 DIAGNOSIS — E119 Type 2 diabetes mellitus without complications: Secondary | ICD-10-CM | POA: Diagnosis not present

## 2014-07-01 DIAGNOSIS — I10 Essential (primary) hypertension: Secondary | ICD-10-CM | POA: Diagnosis not present

## 2014-07-04 DIAGNOSIS — F0280 Dementia in other diseases classified elsewhere without behavioral disturbance: Secondary | ICD-10-CM | POA: Diagnosis not present

## 2014-07-04 DIAGNOSIS — R2689 Other abnormalities of gait and mobility: Secondary | ICD-10-CM | POA: Diagnosis not present

## 2014-07-04 DIAGNOSIS — M159 Polyosteoarthritis, unspecified: Secondary | ICD-10-CM | POA: Diagnosis not present

## 2014-07-04 DIAGNOSIS — I10 Essential (primary) hypertension: Secondary | ICD-10-CM | POA: Diagnosis not present

## 2014-07-04 DIAGNOSIS — M6281 Muscle weakness (generalized): Secondary | ICD-10-CM | POA: Diagnosis not present

## 2014-07-04 DIAGNOSIS — G309 Alzheimer's disease, unspecified: Secondary | ICD-10-CM | POA: Diagnosis not present

## 2014-07-07 DIAGNOSIS — M159 Polyosteoarthritis, unspecified: Secondary | ICD-10-CM | POA: Diagnosis not present

## 2014-07-07 DIAGNOSIS — G309 Alzheimer's disease, unspecified: Secondary | ICD-10-CM | POA: Diagnosis not present

## 2014-07-07 DIAGNOSIS — R2689 Other abnormalities of gait and mobility: Secondary | ICD-10-CM | POA: Diagnosis not present

## 2014-07-07 DIAGNOSIS — M6281 Muscle weakness (generalized): Secondary | ICD-10-CM | POA: Diagnosis not present

## 2014-07-07 DIAGNOSIS — I10 Essential (primary) hypertension: Secondary | ICD-10-CM | POA: Diagnosis not present

## 2014-07-07 DIAGNOSIS — F0280 Dementia in other diseases classified elsewhere without behavioral disturbance: Secondary | ICD-10-CM | POA: Diagnosis not present

## 2014-07-11 DIAGNOSIS — M6281 Muscle weakness (generalized): Secondary | ICD-10-CM | POA: Diagnosis not present

## 2014-07-11 DIAGNOSIS — G309 Alzheimer's disease, unspecified: Secondary | ICD-10-CM | POA: Diagnosis not present

## 2014-07-11 DIAGNOSIS — R2689 Other abnormalities of gait and mobility: Secondary | ICD-10-CM | POA: Diagnosis not present

## 2014-07-11 DIAGNOSIS — M159 Polyosteoarthritis, unspecified: Secondary | ICD-10-CM | POA: Diagnosis not present

## 2014-07-11 DIAGNOSIS — I10 Essential (primary) hypertension: Secondary | ICD-10-CM | POA: Diagnosis not present

## 2014-07-11 DIAGNOSIS — F0280 Dementia in other diseases classified elsewhere without behavioral disturbance: Secondary | ICD-10-CM | POA: Diagnosis not present

## 2014-07-12 DIAGNOSIS — G309 Alzheimer's disease, unspecified: Secondary | ICD-10-CM | POA: Diagnosis not present

## 2014-07-12 DIAGNOSIS — G301 Alzheimer's disease with late onset: Secondary | ICD-10-CM | POA: Diagnosis not present

## 2014-07-12 DIAGNOSIS — M159 Polyosteoarthritis, unspecified: Secondary | ICD-10-CM | POA: Diagnosis not present

## 2014-07-12 DIAGNOSIS — F411 Generalized anxiety disorder: Secondary | ICD-10-CM | POA: Diagnosis not present

## 2014-07-12 DIAGNOSIS — F0280 Dementia in other diseases classified elsewhere without behavioral disturbance: Secondary | ICD-10-CM | POA: Diagnosis not present

## 2014-07-12 DIAGNOSIS — I10 Essential (primary) hypertension: Secondary | ICD-10-CM | POA: Diagnosis not present

## 2014-07-12 DIAGNOSIS — R2689 Other abnormalities of gait and mobility: Secondary | ICD-10-CM | POA: Diagnosis not present

## 2014-07-12 DIAGNOSIS — Z0000 Encounter for general adult medical examination without abnormal findings: Secondary | ICD-10-CM | POA: Diagnosis not present

## 2014-07-12 DIAGNOSIS — M6281 Muscle weakness (generalized): Secondary | ICD-10-CM | POA: Diagnosis not present

## 2014-07-14 DIAGNOSIS — F0280 Dementia in other diseases classified elsewhere without behavioral disturbance: Secondary | ICD-10-CM | POA: Diagnosis not present

## 2014-07-14 DIAGNOSIS — I10 Essential (primary) hypertension: Secondary | ICD-10-CM | POA: Diagnosis not present

## 2014-07-14 DIAGNOSIS — R2689 Other abnormalities of gait and mobility: Secondary | ICD-10-CM | POA: Diagnosis not present

## 2014-07-14 DIAGNOSIS — M6281 Muscle weakness (generalized): Secondary | ICD-10-CM | POA: Diagnosis not present

## 2014-07-14 DIAGNOSIS — M159 Polyosteoarthritis, unspecified: Secondary | ICD-10-CM | POA: Diagnosis not present

## 2014-07-14 DIAGNOSIS — G309 Alzheimer's disease, unspecified: Secondary | ICD-10-CM | POA: Diagnosis not present

## 2014-07-18 DIAGNOSIS — M159 Polyosteoarthritis, unspecified: Secondary | ICD-10-CM | POA: Diagnosis not present

## 2014-07-18 DIAGNOSIS — R2689 Other abnormalities of gait and mobility: Secondary | ICD-10-CM | POA: Diagnosis not present

## 2014-07-18 DIAGNOSIS — F0280 Dementia in other diseases classified elsewhere without behavioral disturbance: Secondary | ICD-10-CM | POA: Diagnosis not present

## 2014-07-18 DIAGNOSIS — G309 Alzheimer's disease, unspecified: Secondary | ICD-10-CM | POA: Diagnosis not present

## 2014-07-18 DIAGNOSIS — I10 Essential (primary) hypertension: Secondary | ICD-10-CM | POA: Diagnosis not present

## 2014-07-18 DIAGNOSIS — M6281 Muscle weakness (generalized): Secondary | ICD-10-CM | POA: Diagnosis not present

## 2014-07-20 DIAGNOSIS — R2689 Other abnormalities of gait and mobility: Secondary | ICD-10-CM | POA: Diagnosis not present

## 2014-07-20 DIAGNOSIS — I10 Essential (primary) hypertension: Secondary | ICD-10-CM | POA: Diagnosis not present

## 2014-07-20 DIAGNOSIS — M159 Polyosteoarthritis, unspecified: Secondary | ICD-10-CM | POA: Diagnosis not present

## 2014-07-20 DIAGNOSIS — F0280 Dementia in other diseases classified elsewhere without behavioral disturbance: Secondary | ICD-10-CM | POA: Diagnosis not present

## 2014-07-20 DIAGNOSIS — M6281 Muscle weakness (generalized): Secondary | ICD-10-CM | POA: Diagnosis not present

## 2014-07-20 DIAGNOSIS — G309 Alzheimer's disease, unspecified: Secondary | ICD-10-CM | POA: Diagnosis not present

## 2014-07-21 DIAGNOSIS — M6281 Muscle weakness (generalized): Secondary | ICD-10-CM | POA: Diagnosis not present

## 2014-07-21 DIAGNOSIS — R2689 Other abnormalities of gait and mobility: Secondary | ICD-10-CM | POA: Diagnosis not present

## 2014-07-21 DIAGNOSIS — F0280 Dementia in other diseases classified elsewhere without behavioral disturbance: Secondary | ICD-10-CM | POA: Diagnosis not present

## 2014-07-21 DIAGNOSIS — G309 Alzheimer's disease, unspecified: Secondary | ICD-10-CM | POA: Diagnosis not present

## 2014-07-21 DIAGNOSIS — M159 Polyosteoarthritis, unspecified: Secondary | ICD-10-CM | POA: Diagnosis not present

## 2014-07-21 DIAGNOSIS — I10 Essential (primary) hypertension: Secondary | ICD-10-CM | POA: Diagnosis not present

## 2014-07-25 DIAGNOSIS — I10 Essential (primary) hypertension: Secondary | ICD-10-CM | POA: Diagnosis not present

## 2014-07-25 DIAGNOSIS — R2689 Other abnormalities of gait and mobility: Secondary | ICD-10-CM | POA: Diagnosis not present

## 2014-07-25 DIAGNOSIS — M6281 Muscle weakness (generalized): Secondary | ICD-10-CM | POA: Diagnosis not present

## 2014-07-25 DIAGNOSIS — M159 Polyosteoarthritis, unspecified: Secondary | ICD-10-CM | POA: Diagnosis not present

## 2014-07-25 DIAGNOSIS — G309 Alzheimer's disease, unspecified: Secondary | ICD-10-CM | POA: Diagnosis not present

## 2014-07-25 DIAGNOSIS — F0280 Dementia in other diseases classified elsewhere without behavioral disturbance: Secondary | ICD-10-CM | POA: Diagnosis not present

## 2014-07-26 DIAGNOSIS — R600 Localized edema: Secondary | ICD-10-CM | POA: Diagnosis not present

## 2014-07-26 DIAGNOSIS — R269 Unspecified abnormalities of gait and mobility: Secondary | ICD-10-CM | POA: Diagnosis not present

## 2014-07-26 DIAGNOSIS — M6281 Muscle weakness (generalized): Secondary | ICD-10-CM | POA: Diagnosis not present

## 2014-07-26 DIAGNOSIS — L853 Xerosis cutis: Secondary | ICD-10-CM | POA: Diagnosis not present

## 2014-07-26 DIAGNOSIS — M159 Polyosteoarthritis, unspecified: Secondary | ICD-10-CM | POA: Diagnosis not present

## 2014-07-26 DIAGNOSIS — G309 Alzheimer's disease, unspecified: Secondary | ICD-10-CM | POA: Diagnosis not present

## 2014-07-26 DIAGNOSIS — I10 Essential (primary) hypertension: Secondary | ICD-10-CM | POA: Diagnosis not present

## 2014-07-26 DIAGNOSIS — R2689 Other abnormalities of gait and mobility: Secondary | ICD-10-CM | POA: Diagnosis not present

## 2014-07-26 DIAGNOSIS — F0280 Dementia in other diseases classified elsewhere without behavioral disturbance: Secondary | ICD-10-CM | POA: Diagnosis not present

## 2014-07-26 DIAGNOSIS — J309 Allergic rhinitis, unspecified: Secondary | ICD-10-CM | POA: Diagnosis not present

## 2014-07-27 DIAGNOSIS — M6281 Muscle weakness (generalized): Secondary | ICD-10-CM | POA: Diagnosis not present

## 2014-07-27 DIAGNOSIS — M79675 Pain in left toe(s): Secondary | ICD-10-CM | POA: Diagnosis not present

## 2014-07-27 DIAGNOSIS — F0280 Dementia in other diseases classified elsewhere without behavioral disturbance: Secondary | ICD-10-CM | POA: Diagnosis not present

## 2014-07-27 DIAGNOSIS — B351 Tinea unguium: Secondary | ICD-10-CM | POA: Diagnosis not present

## 2014-07-27 DIAGNOSIS — G309 Alzheimer's disease, unspecified: Secondary | ICD-10-CM | POA: Diagnosis not present

## 2014-07-27 DIAGNOSIS — I10 Essential (primary) hypertension: Secondary | ICD-10-CM | POA: Diagnosis not present

## 2014-07-27 DIAGNOSIS — M79674 Pain in right toe(s): Secondary | ICD-10-CM | POA: Diagnosis not present

## 2014-07-27 DIAGNOSIS — M159 Polyosteoarthritis, unspecified: Secondary | ICD-10-CM | POA: Diagnosis not present

## 2014-07-27 DIAGNOSIS — R2689 Other abnormalities of gait and mobility: Secondary | ICD-10-CM | POA: Diagnosis not present

## 2014-07-28 DIAGNOSIS — E039 Hypothyroidism, unspecified: Secondary | ICD-10-CM | POA: Diagnosis not present

## 2014-07-28 DIAGNOSIS — F0280 Dementia in other diseases classified elsewhere without behavioral disturbance: Secondary | ICD-10-CM | POA: Diagnosis not present

## 2014-07-28 DIAGNOSIS — E782 Mixed hyperlipidemia: Secondary | ICD-10-CM | POA: Diagnosis not present

## 2014-07-28 DIAGNOSIS — M6281 Muscle weakness (generalized): Secondary | ICD-10-CM | POA: Diagnosis not present

## 2014-07-28 DIAGNOSIS — M159 Polyosteoarthritis, unspecified: Secondary | ICD-10-CM | POA: Diagnosis not present

## 2014-07-28 DIAGNOSIS — I10 Essential (primary) hypertension: Secondary | ICD-10-CM | POA: Diagnosis not present

## 2014-07-28 DIAGNOSIS — G309 Alzheimer's disease, unspecified: Secondary | ICD-10-CM | POA: Diagnosis not present

## 2014-07-28 DIAGNOSIS — R2689 Other abnormalities of gait and mobility: Secondary | ICD-10-CM | POA: Diagnosis not present

## 2014-08-01 DIAGNOSIS — M159 Polyosteoarthritis, unspecified: Secondary | ICD-10-CM | POA: Diagnosis not present

## 2014-08-01 DIAGNOSIS — R2689 Other abnormalities of gait and mobility: Secondary | ICD-10-CM | POA: Diagnosis not present

## 2014-08-01 DIAGNOSIS — G309 Alzheimer's disease, unspecified: Secondary | ICD-10-CM | POA: Diagnosis not present

## 2014-08-01 DIAGNOSIS — I10 Essential (primary) hypertension: Secondary | ICD-10-CM | POA: Diagnosis not present

## 2014-08-01 DIAGNOSIS — F0280 Dementia in other diseases classified elsewhere without behavioral disturbance: Secondary | ICD-10-CM | POA: Diagnosis not present

## 2014-08-01 DIAGNOSIS — M6281 Muscle weakness (generalized): Secondary | ICD-10-CM | POA: Diagnosis not present

## 2014-08-04 DIAGNOSIS — F0280 Dementia in other diseases classified elsewhere without behavioral disturbance: Secondary | ICD-10-CM | POA: Diagnosis not present

## 2014-08-04 DIAGNOSIS — G309 Alzheimer's disease, unspecified: Secondary | ICD-10-CM | POA: Diagnosis not present

## 2014-08-04 DIAGNOSIS — I10 Essential (primary) hypertension: Secondary | ICD-10-CM | POA: Diagnosis not present

## 2014-08-04 DIAGNOSIS — R2689 Other abnormalities of gait and mobility: Secondary | ICD-10-CM | POA: Diagnosis not present

## 2014-08-04 DIAGNOSIS — M159 Polyosteoarthritis, unspecified: Secondary | ICD-10-CM | POA: Diagnosis not present

## 2014-08-05 ENCOUNTER — Encounter (HOSPITAL_COMMUNITY): Payer: Self-pay | Admitting: Emergency Medicine

## 2014-08-05 ENCOUNTER — Emergency Department (HOSPITAL_COMMUNITY)
Admission: EM | Admit: 2014-08-05 | Discharge: 2014-08-05 | Disposition: A | Discharge status: Home / Self Care | Payer: Medicare Other | Attending: Emergency Medicine | Admitting: Emergency Medicine

## 2014-08-05 ENCOUNTER — Emergency Department (HOSPITAL_COMMUNITY): Payer: Medicare Other

## 2014-08-05 DIAGNOSIS — I10 Essential (primary) hypertension: Secondary | ICD-10-CM | POA: Diagnosis not present

## 2014-08-05 DIAGNOSIS — R569 Unspecified convulsions: Secondary | ICD-10-CM | POA: Diagnosis not present

## 2014-08-05 DIAGNOSIS — Z87891 Personal history of nicotine dependence: Secondary | ICD-10-CM | POA: Diagnosis not present

## 2014-08-05 DIAGNOSIS — Z8673 Personal history of transient ischemic attack (TIA), and cerebral infarction without residual deficits: Secondary | ICD-10-CM | POA: Diagnosis not present

## 2014-08-05 DIAGNOSIS — J189 Pneumonia, unspecified organism: Secondary | ICD-10-CM

## 2014-08-05 DIAGNOSIS — I517 Cardiomegaly: Secondary | ICD-10-CM | POA: Diagnosis not present

## 2014-08-05 DIAGNOSIS — Z7982 Long term (current) use of aspirin: Secondary | ICD-10-CM | POA: Insufficient documentation

## 2014-08-05 DIAGNOSIS — G309 Alzheimer's disease, unspecified: Secondary | ICD-10-CM | POA: Insufficient documentation

## 2014-08-05 DIAGNOSIS — F0280 Dementia in other diseases classified elsewhere without behavioral disturbance: Secondary | ICD-10-CM | POA: Diagnosis not present

## 2014-08-05 DIAGNOSIS — Z79899 Other long term (current) drug therapy: Secondary | ICD-10-CM | POA: Insufficient documentation

## 2014-08-05 DIAGNOSIS — J984 Other disorders of lung: Secondary | ICD-10-CM | POA: Diagnosis not present

## 2014-08-05 DIAGNOSIS — I2510 Atherosclerotic heart disease of native coronary artery without angina pectoris: Secondary | ICD-10-CM | POA: Insufficient documentation

## 2014-08-05 DIAGNOSIS — J159 Unspecified bacterial pneumonia: Secondary | ICD-10-CM | POA: Diagnosis not present

## 2014-08-05 LAB — CBC WITH DIFFERENTIAL/PLATELET
BASOS PCT: 0 % (ref 0–1)
Basophils Absolute: 0 10*3/uL (ref 0.0–0.1)
Eosinophils Absolute: 0.3 10*3/uL (ref 0.0–0.7)
Eosinophils Relative: 4 % (ref 0–5)
HCT: 37.3 % — ABNORMAL LOW (ref 39.0–52.0)
Hemoglobin: 12.3 g/dL — ABNORMAL LOW (ref 13.0–17.0)
LYMPHS PCT: 17 % (ref 12–46)
Lymphs Abs: 1.3 10*3/uL (ref 0.7–4.0)
MCH: 29.1 pg (ref 26.0–34.0)
MCHC: 33 g/dL (ref 30.0–36.0)
MCV: 88.4 fL (ref 78.0–100.0)
MONO ABS: 0.7 10*3/uL (ref 0.1–1.0)
Monocytes Relative: 10 % (ref 3–12)
NEUTROS ABS: 5.2 10*3/uL (ref 1.7–7.7)
NEUTROS PCT: 69 % (ref 43–77)
Platelets: 322 10*3/uL (ref 150–400)
RBC: 4.22 MIL/uL (ref 4.22–5.81)
RDW: 13.7 % (ref 11.5–15.5)
WBC: 7.5 10*3/uL (ref 4.0–10.5)

## 2014-08-05 LAB — COMPREHENSIVE METABOLIC PANEL
ALBUMIN: 3.1 g/dL — AB (ref 3.5–5.2)
ALT: 17 U/L (ref 0–53)
AST: 28 U/L (ref 0–37)
Alkaline Phosphatase: 104 U/L (ref 39–117)
Anion gap: 12 (ref 5–15)
BUN: 16 mg/dL (ref 6–23)
CALCIUM: 9.2 mg/dL (ref 8.4–10.5)
CO2: 21 mmol/L (ref 19–32)
CREATININE: 1.04 mg/dL (ref 0.50–1.35)
Chloride: 104 mmol/L (ref 96–112)
GFR calc Af Amer: 73 mL/min — ABNORMAL LOW (ref >90)
GFR calc non Af Amer: 63 mL/min — ABNORMAL LOW (ref >90)
Glucose, Bld: 138 mg/dL — ABNORMAL HIGH (ref 70–99)
Potassium: 3.7 mmol/L (ref 3.5–5.1)
Sodium: 137 mmol/L (ref 135–145)
TOTAL PROTEIN: 6.7 g/dL (ref 6.0–8.3)
Total Bilirubin: 0.5 mg/dL (ref 0.3–1.2)

## 2014-08-05 LAB — URINALYSIS, ROUTINE W REFLEX MICROSCOPIC
BILIRUBIN URINE: NEGATIVE
Glucose, UA: NEGATIVE mg/dL
Ketones, ur: NEGATIVE mg/dL
LEUKOCYTES UA: NEGATIVE
NITRITE: NEGATIVE
PH: 6 (ref 5.0–8.0)
Protein, ur: 30 mg/dL — AB
SPECIFIC GRAVITY, URINE: 1.027 (ref 1.005–1.030)
UROBILINOGEN UA: 0.2 mg/dL (ref 0.0–1.0)

## 2014-08-05 LAB — URINE MICROSCOPIC-ADD ON

## 2014-08-05 MED ORDER — LEVOFLOXACIN 750 MG PO TABS
750.0000 mg | ORAL_TABLET | Freq: Every day | ORAL | Status: DC
Start: 2014-08-06 — End: 2014-09-25

## 2014-08-05 MED ORDER — LEVOFLOXACIN IN D5W 500 MG/100ML IV SOLN
500.0000 mg | Freq: Once | INTRAVENOUS | Status: AC
  Administered 2014-08-05: 500 mg via INTRAVENOUS
  Filled 2014-08-05: qty 100

## 2014-08-05 MED ORDER — LEVOFLOXACIN 750 MG PO TABS
750.0000 mg | ORAL_TABLET | Freq: Once | ORAL | Status: AC
  Administered 2014-08-05: 750 mg via ORAL
  Filled 2014-08-05: qty 1

## 2014-08-05 NOTE — ED Notes (Signed)
Pt's step-son present and states pt is at his baseline. He states pt often has episodes when he is developing a fever where he shakes all over almost like he is having a seizure. Pt's step son believes this is what happened today. Pt was sweaty on arrival

## 2014-08-05 NOTE — Discharge Instructions (Signed)

## 2014-08-05 NOTE — ED Notes (Signed)
Pt from [facility]. Pt had 2 minute seizure (jerking all over then became stiff). Pt has no hx of seizures.  Pt is nonverbal at baseline with dementia. BP 134/60, HR 88, CBG 117

## 2014-08-05 NOTE — ED Provider Notes (Signed)
CSN: [id number]     Arrival date & time 08/05/14  0729 History   First MD Initiated Contact with Patient 08/05/14 [phone]     Chief Complaint  Patient presents with  . Seizures     (Consider location/radiation/quality/duration/timing/severity/associated sxs/prior Treatment) HPI The patient had a shaking episode this morning as he was being showered by caregivers. The patient's son reports that he does have these episodes intermittently. He does not have a formal seizure diagnosis but his son reports sometimes if he gets a chill he will shake violently. Occasionally this symptom has proceeded an infection such as a UTI. He reports he has very advanced Alzheimer's dementia and at baseline the patient is essentially nonverbal and has very limited ambulatory ability. He reports he can move very stiffly using a walker. Past Medical History  Diagnosis Date  . CAROTID ARTERY DISEASE 08/20/2009    doppler 2/10:  0-39% bilat ICA  / Doppler, September 21, 2010, stable, 0-39% bilateral  . Essential hypertension, benign 08/22/2008  . SINUS BRADYCARDIA 08/22/2008  . Edema 08/21/2009    Started January, 2011, probable mild volume overload and venous insufficiency  . Aortic stenosis     Mild, echo, March, 2011 / mild, echo, April, 2012  . Aortic insufficiency     Moderate, echo, March, 2011 / Moderate, echo, April, 2012  . Atherosclerosis     aorta; duplex 1/12: stable, no dissection, mid to distal irregular plaque with ulcerative appearance similar to prior study; severe stenosis IMA  . Aortic root dilation     Mild dilatation thoracic aorta  . Hemoptysis     Significant outpatient episode February 2 011, related to pneumonia by her  . Ejection fraction     60% echo March, 2011 / EF 60-65%,, September 21, 2010, echo  . Pneumonia     Right middle lobe, February, 2011 resolved  . Pre-syncope   . Varicose veins     Patient had a procedure related to the veins in his legs, 2012  . TIA (transient ischemic attack)      Possible TIA July, 2012, /  episode of staring April 04, 2011   History reviewed. No pertinent past surgical history. History reviewed. No pertinent family history. History  Substance Use Topics  . Smoking status: Former Smoker    Quit date: 06/03/1970  . Smokeless tobacco: Not on file  . Alcohol Use: Yes     Comment: pt recieve 1 beer daily    Review of Systems    Allergies  Review of patient's allergies indicates no known allergies.  Home Medications   Prior to Admission medications   Medication Sig Start Date End Date Taking? Authorizing Provider  ALPRAZolam Prudy Feeler) 0.5 MG tablet Take 0.25 mg by mouth every 6 (six) hours as needed for anxiety or sleep (anxiety).    Yes Historical Provider, MD  aspirin 81 MG tablet Take 81 mg by mouth daily.     Yes Historical Provider, MD  docusate sodium (COLACE) 100 MG capsule Take 100 mg by mouth 2 (two) times daily.   Yes Historical Provider, MD  furosemide (LASIX) 20 MG tablet Take 20 mg by mouth daily. Pt takes Monday, Tuesday, Wednesday and Friday   Yes Historical Provider, MD  hydrochlorothiazide 25 MG tablet Take 12.5 mg by mouth every morning.    Yes Historical Provider, MD  loratadine (CLARITIN) 10 MG tablet Take 10 mg by mouth daily.   Yes Historical Provider, MD  Skin Protectants, Misc. (EUCERIN) cream Apply 1  application topically 2 (two) times daily. Pt applies to dry areas   Yes Historical Provider, MD  levofloxacin (LEVAQUIN) 750 MG tablet Take 1 tablet (750 mg total) by mouth daily. First dose of Levaquin given in the emergency department on 3\4. Medication is to start on 3\5. 08/06/14   Arby BarretteMarcy Dal Blew, MD   BP 150/67 mmHg  Pulse 65  Temp(Src) 97.4 F (36.3 C) (Axillary)  Resp 14  SpO2 99% Physical Exam  ED Course  Procedures (including critical care time) Labs Review Labs Reviewed  COMPREHENSIVE METABOLIC PANEL - Abnormal; Notable for the following:    Glucose, Bld 138 (*)    Albumin 3.1 (*)    GFR calc non Af  Amer 63 (*)    GFR calc Af Amer 73 (*)    All other components within normal limits  CBC WITH DIFFERENTIAL/PLATELET - Abnormal; Notable for the following:    Hemoglobin 12.3 (*)    HCT 37.3 (*)    All other components within normal limits  URINALYSIS, ROUTINE W REFLEX MICROSCOPIC - Abnormal; Notable for the following:    APPearance CLOUDY (*)    Hgb urine dipstick SMALL (*)    Protein, ur 30 (*)    All other components within normal limits  URINE MICROSCOPIC-ADD ON - Abnormal; Notable for the following:    Squamous Epithelial / LPF FEW (*)    Bacteria, UA FEW (*)    Casts HYALINE CASTS (*)    All other components within normal limits  URINE CULTURE    Imaging Review Dg Chest 2 View  08/05/2014   CLINICAL DATA:  Confusion.  Seizure.  EXAM: CHEST  2 VIEW  COMPARISON:  Earlier same day.  05/09/2014.  07/01/2013.  FINDINGS: The heart is mildly enlarged. There is pneumonia in the left lower lobe. Remainder the chest is clear. There is an old compression fracture in the mid thoracic region an the thoracolumbar junction region. No effusions.  IMPRESSION: This study confirms left lower lobe pneumonia.   Electronically Signed   By: Paulina FusiMark  Shogry M.D.   On: 08/05/2014 10:48   Dg Chest Portable 1 View  08/05/2014   CLINICAL DATA:  Weakness and shaking this morning. Personal history of pneumonia.  EXAM: PORTABLE CHEST - 1 VIEW  COMPARISON:  05/09/2014.  FINDINGS: Artifact overlies the chest. Cardiac silhouette remains enlarged. Slightly chronically prominent interstitial markings remain evident. Abnormal retrocardiac density on the left could represent left lower lobe pneumonia or left effusion. Consider lateral radiography when possible.  IMPRESSION: Cardiomegaly. Abnormal retrocardiac density on the left could represent pneumonia or effusion. Consider lateral radiography when possible.   Electronically Signed   By: Paulina FusiMark  Shogry M.D.   On: 08/05/2014 08:23     EKG Interpretation None      MDM    Final diagnoses:  Healthcare-associated pneumonia   The patient presents as outlined above with an acute onset of a shaking episode. Due to advanced Alzheimer dementia the patient himself is not the historian. There is no report of incrementally increasing illness however the patient's son notes that previously with a similar type of episode the patient went on to manifest an infection. Chest x-ray shows left lower lobe pneumonia by radiology interpretation. The patient will be treated outpatient for pneumonia. His general condition in the emergency department is good, he is alert, his vital signs are stable and he shows no signs of respiratory distress.    Arby BarretteMarcy Eustacio Ellen, MD 08/05/14 1101

## 2014-08-05 NOTE — ED Notes (Signed)
Pt currently in xray

## 2014-08-05 NOTE — ED Notes (Signed)
Pt step son also states facility stated that pt was in process of getting a shower and when they removed him from the shower to dry him off, that is when he started violently shaking. Son is on the phone now with Dr. Donnald GarrePfeiffer stating that pt has no hx of seizures and that he sometimes has episodes like today where he shakes violently but never loses control of his bowels. States that pt normal baseline can get up and walk around with a walker, however is normally aphasic.

## 2014-08-06 LAB — URINE CULTURE
Colony Count: NO GROWTH
Culture: NO GROWTH

## 2014-08-08 DIAGNOSIS — R2689 Other abnormalities of gait and mobility: Secondary | ICD-10-CM | POA: Diagnosis not present

## 2014-08-08 DIAGNOSIS — F0280 Dementia in other diseases classified elsewhere without behavioral disturbance: Secondary | ICD-10-CM | POA: Diagnosis not present

## 2014-08-08 DIAGNOSIS — I10 Essential (primary) hypertension: Secondary | ICD-10-CM | POA: Diagnosis not present

## 2014-08-08 DIAGNOSIS — M159 Polyosteoarthritis, unspecified: Secondary | ICD-10-CM | POA: Diagnosis not present

## 2014-08-08 DIAGNOSIS — G309 Alzheimer's disease, unspecified: Secondary | ICD-10-CM | POA: Diagnosis not present

## 2014-08-09 DIAGNOSIS — J13 Pneumonia due to Streptococcus pneumoniae: Secondary | ICD-10-CM | POA: Diagnosis not present

## 2014-08-11 DIAGNOSIS — M159 Polyosteoarthritis, unspecified: Secondary | ICD-10-CM | POA: Diagnosis not present

## 2014-08-11 DIAGNOSIS — R2689 Other abnormalities of gait and mobility: Secondary | ICD-10-CM | POA: Diagnosis not present

## 2014-08-11 DIAGNOSIS — I10 Essential (primary) hypertension: Secondary | ICD-10-CM | POA: Diagnosis not present

## 2014-08-11 DIAGNOSIS — G309 Alzheimer's disease, unspecified: Secondary | ICD-10-CM | POA: Diagnosis not present

## 2014-08-11 DIAGNOSIS — F0280 Dementia in other diseases classified elsewhere without behavioral disturbance: Secondary | ICD-10-CM | POA: Diagnosis not present

## 2014-08-15 DIAGNOSIS — M159 Polyosteoarthritis, unspecified: Secondary | ICD-10-CM | POA: Diagnosis not present

## 2014-08-15 DIAGNOSIS — R2689 Other abnormalities of gait and mobility: Secondary | ICD-10-CM | POA: Diagnosis not present

## 2014-08-15 DIAGNOSIS — G309 Alzheimer's disease, unspecified: Secondary | ICD-10-CM | POA: Diagnosis not present

## 2014-08-15 DIAGNOSIS — F0280 Dementia in other diseases classified elsewhere without behavioral disturbance: Secondary | ICD-10-CM | POA: Diagnosis not present

## 2014-08-15 DIAGNOSIS — I10 Essential (primary) hypertension: Secondary | ICD-10-CM | POA: Diagnosis not present

## 2014-08-16 DIAGNOSIS — F411 Generalized anxiety disorder: Secondary | ICD-10-CM | POA: Diagnosis not present

## 2014-08-16 DIAGNOSIS — G301 Alzheimer's disease with late onset: Secondary | ICD-10-CM | POA: Diagnosis not present

## 2014-08-18 DIAGNOSIS — F0280 Dementia in other diseases classified elsewhere without behavioral disturbance: Secondary | ICD-10-CM | POA: Diagnosis not present

## 2014-08-18 DIAGNOSIS — M159 Polyosteoarthritis, unspecified: Secondary | ICD-10-CM | POA: Diagnosis not present

## 2014-08-18 DIAGNOSIS — I10 Essential (primary) hypertension: Secondary | ICD-10-CM | POA: Diagnosis not present

## 2014-08-18 DIAGNOSIS — R2689 Other abnormalities of gait and mobility: Secondary | ICD-10-CM | POA: Diagnosis not present

## 2014-08-18 DIAGNOSIS — G309 Alzheimer's disease, unspecified: Secondary | ICD-10-CM | POA: Diagnosis not present

## 2014-08-23 DIAGNOSIS — F411 Generalized anxiety disorder: Secondary | ICD-10-CM | POA: Diagnosis not present

## 2014-08-23 DIAGNOSIS — M159 Polyosteoarthritis, unspecified: Secondary | ICD-10-CM | POA: Diagnosis not present

## 2014-08-23 DIAGNOSIS — G301 Alzheimer's disease with late onset: Secondary | ICD-10-CM | POA: Diagnosis not present

## 2014-08-23 DIAGNOSIS — R2689 Other abnormalities of gait and mobility: Secondary | ICD-10-CM | POA: Diagnosis not present

## 2014-08-23 DIAGNOSIS — I10 Essential (primary) hypertension: Secondary | ICD-10-CM | POA: Diagnosis not present

## 2014-08-23 DIAGNOSIS — J309 Allergic rhinitis, unspecified: Secondary | ICD-10-CM | POA: Diagnosis not present

## 2014-08-23 DIAGNOSIS — K5900 Constipation, unspecified: Secondary | ICD-10-CM | POA: Diagnosis not present

## 2014-08-23 DIAGNOSIS — R600 Localized edema: Secondary | ICD-10-CM | POA: Diagnosis not present

## 2014-08-23 DIAGNOSIS — L853 Xerosis cutis: Secondary | ICD-10-CM | POA: Diagnosis not present

## 2014-08-24 DIAGNOSIS — M159 Polyosteoarthritis, unspecified: Secondary | ICD-10-CM | POA: Diagnosis not present

## 2014-08-24 DIAGNOSIS — F0280 Dementia in other diseases classified elsewhere without behavioral disturbance: Secondary | ICD-10-CM | POA: Diagnosis not present

## 2014-08-24 DIAGNOSIS — I10 Essential (primary) hypertension: Secondary | ICD-10-CM | POA: Diagnosis not present

## 2014-08-24 DIAGNOSIS — R2689 Other abnormalities of gait and mobility: Secondary | ICD-10-CM | POA: Diagnosis not present

## 2014-08-24 DIAGNOSIS — G309 Alzheimer's disease, unspecified: Secondary | ICD-10-CM | POA: Diagnosis not present

## 2014-08-26 ENCOUNTER — Emergency Department (HOSPITAL_COMMUNITY)
Admission: EM | Admit: 2014-08-26 | Discharge: 2014-08-26 | Disposition: A | Discharge status: Home / Self Care | Payer: Medicare Other | Attending: Emergency Medicine | Admitting: Emergency Medicine

## 2014-08-26 ENCOUNTER — Encounter (HOSPITAL_COMMUNITY): Payer: Self-pay | Admitting: Emergency Medicine

## 2014-08-26 DIAGNOSIS — I10 Essential (primary) hypertension: Secondary | ICD-10-CM | POA: Insufficient documentation

## 2014-08-26 DIAGNOSIS — T1490 Injury, unspecified: Secondary | ICD-10-CM | POA: Diagnosis not present

## 2014-08-26 DIAGNOSIS — S0991XA Unspecified injury of ear, initial encounter: Secondary | ICD-10-CM | POA: Diagnosis present

## 2014-08-26 DIAGNOSIS — Y9389 Activity, other specified: Secondary | ICD-10-CM | POA: Insufficient documentation

## 2014-08-26 DIAGNOSIS — W19XXXA Unspecified fall, initial encounter: Secondary | ICD-10-CM | POA: Diagnosis not present

## 2014-08-26 DIAGNOSIS — F0390 Unspecified dementia without behavioral disturbance: Secondary | ICD-10-CM | POA: Insufficient documentation

## 2014-08-26 DIAGNOSIS — Y998 Other external cause status: Secondary | ICD-10-CM | POA: Insufficient documentation

## 2014-08-26 DIAGNOSIS — Z87891 Personal history of nicotine dependence: Secondary | ICD-10-CM | POA: Insufficient documentation

## 2014-08-26 DIAGNOSIS — Z7982 Long term (current) use of aspirin: Secondary | ICD-10-CM | POA: Insufficient documentation

## 2014-08-26 DIAGNOSIS — M159 Polyosteoarthritis, unspecified: Secondary | ICD-10-CM | POA: Diagnosis not present

## 2014-08-26 DIAGNOSIS — Y92128 Other place in nursing home as the place of occurrence of the external cause: Secondary | ICD-10-CM | POA: Insufficient documentation

## 2014-08-26 DIAGNOSIS — I2510 Atherosclerotic heart disease of native coronary artery without angina pectoris: Secondary | ICD-10-CM | POA: Insufficient documentation

## 2014-08-26 DIAGNOSIS — Z79899 Other long term (current) drug therapy: Secondary | ICD-10-CM | POA: Insufficient documentation

## 2014-08-26 DIAGNOSIS — R2689 Other abnormalities of gait and mobility: Secondary | ICD-10-CM | POA: Diagnosis not present

## 2014-08-26 DIAGNOSIS — S00411A Abrasion of right ear, initial encounter: Secondary | ICD-10-CM | POA: Insufficient documentation

## 2014-08-26 DIAGNOSIS — Z8701 Personal history of pneumonia (recurrent): Secondary | ICD-10-CM | POA: Diagnosis not present

## 2014-08-26 DIAGNOSIS — Z8673 Personal history of transient ischemic attack (TIA), and cerebral infarction without residual deficits: Secondary | ICD-10-CM | POA: Diagnosis not present

## 2014-08-26 DIAGNOSIS — Z792 Long term (current) use of antibiotics: Secondary | ICD-10-CM | POA: Insufficient documentation

## 2014-08-26 DIAGNOSIS — G309 Alzheimer's disease, unspecified: Secondary | ICD-10-CM | POA: Diagnosis not present

## 2014-08-26 DIAGNOSIS — R0902 Hypoxemia: Secondary | ICD-10-CM | POA: Diagnosis not present

## 2014-08-26 DIAGNOSIS — F0280 Dementia in other diseases classified elsewhere without behavioral disturbance: Secondary | ICD-10-CM | POA: Diagnosis not present

## 2014-08-26 NOTE — Progress Notes (Signed)
CSW attempted to speak with patient at bedside. However, the pt was not communicative and was falling asleep. Step son was present and confirms that the pt is from [facility] [place]. According to stepson, the pt lives in the memory care unit in the facility and shares a room with his wife.  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 Also, he states that the pt has a hx of dementia.   Step son states that the pt receives assistance completing his ADL's. Also, he states that the pt has fallen within the past 6 months and falls often. He states that the pt was at [facility] a month ago due to seizures.  Doreene AdasStepson states he and his siblings are a primary support for pt and that they visit him daily. Stepson did not express any concerns and states that he does not have any questions.  Rodman KeyJeff Gilbert [phone]  [phone]  Deyjah Kindel, [place] [phone] ED CSW 08/26/2014 8:57 PM

## 2014-08-26 NOTE — ED Notes (Signed)
Pt expected from retirement home, severe alzheimers disease, deaf, difficult to communicate with at baseline. Pt's brother Julieanne MansonRichard Gilbert provides cell phone number [phone] for purposes of communication and requests minimal medical testing, states facility sent pt prior to communicating with family. States family member is en route.

## 2014-08-26 NOTE — ED Provider Notes (Signed)
CSN: [id number]     Arrival date & time 08/26/14  1824 History   First MD Initiated Contact with Patient 08/26/14 1844     Chief Complaint  Patient presents with  . Fall     (Consider location/radiation/quality/duration/timing/severity/associated sxs/prior Treatment) Patient is a 81 y.o. male presenting with fall. The history is provided by the patient, the EMS personnel and a relative.  Fall   level V caveat due to dementia. Reportedly had a fall at the nursing home. History of dementia and cannot give a history. Also reportedly deaf. Family member reportedly called and said to limit treatment if needed. Patient cannot participate in the history.  Past Medical History  Diagnosis Date  . CAROTID ARTERY DISEASE 08/20/2009    doppler 2/10:  0-39% bilat ICA  / Doppler, September 21, 2010, stable, 0-39% bilateral  . Essential hypertension, benign 08/22/2008  . SINUS BRADYCARDIA 08/22/2008  . Edema 08/21/2009    Started January, 2011, probable mild volume overload and venous insufficiency  . Aortic stenosis     Mild, echo, March, 2011 / mild, echo, April, 2012  . Aortic insufficiency     Moderate, echo, March, 2011 / Moderate, echo, April, 2012  . Atherosclerosis     aorta; duplex 1/12: stable, no dissection, mid to distal irregular plaque with ulcerative appearance similar to prior study; severe stenosis IMA  . Aortic root dilation     Mild dilatation thoracic aorta  . Hemoptysis     Significant outpatient episode February 2 011, related to pneumonia by her  . Ejection fraction     60% echo March, 2011 / EF 60-65%,, September 21, 2010, echo  . Pneumonia     Right middle lobe, February, 2011 resolved  . Pre-syncope   . Varicose veins     Patient had a procedure related to the veins in his legs, 2012  . TIA (transient ischemic attack)     Possible TIA July, 2012, /  episode of staring April 04, 2011   History reviewed. No pertinent past surgical history. No family history on  file. History  Substance Use Topics  . Smoking status: Former Smoker    Quit date: 06/03/1970  . Smokeless tobacco: Not on file  . Alcohol Use: Yes     Comment: pt recieve 1 beer daily    Review of Systems  Unable to perform ROS     Allergies  Review of patient's allergies indicates no known allergies.  Home Medications   Prior to Admission medications   Medication Sig Start Date End Date Taking? Authorizing Provider  albuterol (PROVENTIL HFA;VENTOLIN HFA) 108 (90 BASE) MCG/ACT inhaler Inhale 2 puffs into the lungs every 6 (six) hours as needed for wheezing or shortness of breath.   Yes Historical Provider, MD  ALPRAZolam Prudy Feeler(XANAX) 0.5 MG tablet Take 0.25 mg by mouth every 6 (six) hours as needed for anxiety or sleep (anxiety).    Yes Historical Provider, MD  docusate sodium (COLACE) 100 MG capsule Take 100 mg by mouth 2 (two) times daily.   Yes Historical Provider, MD  furosemide (LASIX) 20 MG tablet Take 20 mg by mouth daily. Monday, Tuesday, Wednesday and Friday.   Yes Historical Provider, MD  hydrochlorothiazide 25 MG tablet Take 12.5 mg by mouth every morning.    Yes Historical Provider, MD  loratadine (CLARITIN) 10 MG tablet Take 10 mg by mouth daily.   Yes Historical Provider, MD  Melatonin 3 MG TABS Take 3 mg by mouth at bedtime.  Yes Historical Provider, MD  Skin Protectants, Misc. (BAZA PROTECT EX) Apply 1 application topically as needed (after each incontinent episode.). Apply to affected area.  Notify MD if symptoms persist longer than 1 week.   Yes Historical Provider, MD  Skin Protectants, Misc. (EUCERIN) cream Apply 1 application topically 2 (two) times daily. To dry areas.   Yes Historical Provider, MD  aspirin 81 MG tablet Take 81 mg by mouth daily.      Historical Provider, MD  levofloxacin (LEVAQUIN) 750 MG tablet Take 1 tablet (750 mg total) by mouth daily. First dose of Levaquin given in the emergency department on 3\4. Medication is to start on 3\5. Patient not  taking: Reported on 08/26/2014 08/06/14   Arby Barrette, MD   BP 133/59 mmHg  Pulse 92  Temp(Src) 100.2 F (37.9 C) (Oral)  Resp 18  SpO2 96% Physical Exam  Constitutional: He appears well-developed and well-nourished.  HENT:  Superficial abrasion to right external ear.  Eyes: Pupils are equal, round, and reactive to light.  Neck: Neck supple.  No midline cervical tenderness  Cardiovascular: Normal rate and regular rhythm.   Pulmonary/Chest: Effort normal.  Abdominal: Soft. He exhibits no distension. There is no tenderness.  Musculoskeletal: He exhibits no tenderness.  No tenderness over her extremities.may have slightly decreased range of motion left hip does not appear tender.  Neurological: He is alert.  Patient will look to voice.  Skin: Skin is warm.    ED Course  Procedures (including critical care time) Labs Review Labs Reviewed - No data to display  Imaging Review No results found.   EKG Interpretation None      MDM   Final diagnoses:  Fall, initial encounter  Dementia, without behavioral disturbance    Patient with fall. Abrasion to ear but otherwise no clear trauma. Discussed with patient's son. We'll not do aggressive treatment or evaluation. The patient is at his baseline per the family member.    Benjiman Core, MD 08/27/14 [phone]

## 2014-08-26 NOTE — ED Notes (Signed)
Bed: WA06 Expected date:  Expected time:  Means of arrival:  Comments: EMS-fall 

## 2014-08-26 NOTE — Discharge Instructions (Signed)

## 2014-08-26 NOTE — ED Notes (Signed)
Pt BIB EMS. Pt [place] [facility] [place]. Pt had unwitnessed fall per staff. Pt was found sitting next to night stand. Pt has no signs of injury or deformities per staff. Pt not on blood thinners. Pt alert to baseline. Skin warm, dry.

## 2014-08-29 DIAGNOSIS — G309 Alzheimer's disease, unspecified: Secondary | ICD-10-CM | POA: Diagnosis not present

## 2014-08-29 DIAGNOSIS — I10 Essential (primary) hypertension: Secondary | ICD-10-CM | POA: Diagnosis not present

## 2014-08-29 DIAGNOSIS — M159 Polyosteoarthritis, unspecified: Secondary | ICD-10-CM | POA: Diagnosis not present

## 2014-08-29 DIAGNOSIS — F0280 Dementia in other diseases classified elsewhere without behavioral disturbance: Secondary | ICD-10-CM | POA: Diagnosis not present

## 2014-08-29 DIAGNOSIS — R2689 Other abnormalities of gait and mobility: Secondary | ICD-10-CM | POA: Diagnosis not present

## 2014-08-30 DIAGNOSIS — Z9181 History of falling: Secondary | ICD-10-CM | POA: Diagnosis not present

## 2014-08-31 DIAGNOSIS — M159 Polyosteoarthritis, unspecified: Secondary | ICD-10-CM | POA: Diagnosis not present

## 2014-08-31 DIAGNOSIS — I10 Essential (primary) hypertension: Secondary | ICD-10-CM | POA: Diagnosis not present

## 2014-08-31 DIAGNOSIS — G309 Alzheimer's disease, unspecified: Secondary | ICD-10-CM | POA: Diagnosis not present

## 2014-08-31 DIAGNOSIS — F0280 Dementia in other diseases classified elsewhere without behavioral disturbance: Secondary | ICD-10-CM | POA: Diagnosis not present

## 2014-08-31 DIAGNOSIS — R2689 Other abnormalities of gait and mobility: Secondary | ICD-10-CM | POA: Diagnosis not present

## 2014-09-05 DIAGNOSIS — I10 Essential (primary) hypertension: Secondary | ICD-10-CM | POA: Diagnosis not present

## 2014-09-05 DIAGNOSIS — F0280 Dementia in other diseases classified elsewhere without behavioral disturbance: Secondary | ICD-10-CM | POA: Diagnosis not present

## 2014-09-05 DIAGNOSIS — G309 Alzheimer's disease, unspecified: Secondary | ICD-10-CM | POA: Diagnosis not present

## 2014-09-05 DIAGNOSIS — M159 Polyosteoarthritis, unspecified: Secondary | ICD-10-CM | POA: Diagnosis not present

## 2014-09-05 DIAGNOSIS — R2689 Other abnormalities of gait and mobility: Secondary | ICD-10-CM | POA: Diagnosis not present

## 2014-09-07 DIAGNOSIS — G309 Alzheimer's disease, unspecified: Secondary | ICD-10-CM | POA: Diagnosis not present

## 2014-09-07 DIAGNOSIS — R2689 Other abnormalities of gait and mobility: Secondary | ICD-10-CM | POA: Diagnosis not present

## 2014-09-07 DIAGNOSIS — M159 Polyosteoarthritis, unspecified: Secondary | ICD-10-CM | POA: Diagnosis not present

## 2014-09-07 DIAGNOSIS — F0280 Dementia in other diseases classified elsewhere without behavioral disturbance: Secondary | ICD-10-CM | POA: Diagnosis not present

## 2014-09-07 DIAGNOSIS — I10 Essential (primary) hypertension: Secondary | ICD-10-CM | POA: Diagnosis not present

## 2014-09-12 DIAGNOSIS — G309 Alzheimer's disease, unspecified: Secondary | ICD-10-CM | POA: Diagnosis not present

## 2014-09-12 DIAGNOSIS — F0280 Dementia in other diseases classified elsewhere without behavioral disturbance: Secondary | ICD-10-CM | POA: Diagnosis not present

## 2014-09-12 DIAGNOSIS — R2689 Other abnormalities of gait and mobility: Secondary | ICD-10-CM | POA: Diagnosis not present

## 2014-09-12 DIAGNOSIS — I10 Essential (primary) hypertension: Secondary | ICD-10-CM | POA: Diagnosis not present

## 2014-09-12 DIAGNOSIS — M159 Polyosteoarthritis, unspecified: Secondary | ICD-10-CM | POA: Diagnosis not present

## 2014-09-13 DIAGNOSIS — F411 Generalized anxiety disorder: Secondary | ICD-10-CM | POA: Diagnosis not present

## 2014-09-13 DIAGNOSIS — G301 Alzheimer's disease with late onset: Secondary | ICD-10-CM | POA: Diagnosis not present

## 2014-09-13 DIAGNOSIS — M25559 Pain in unspecified hip: Secondary | ICD-10-CM | POA: Diagnosis not present

## 2014-09-14 DIAGNOSIS — R2689 Other abnormalities of gait and mobility: Secondary | ICD-10-CM | POA: Diagnosis not present

## 2014-09-14 DIAGNOSIS — I10 Essential (primary) hypertension: Secondary | ICD-10-CM | POA: Diagnosis not present

## 2014-09-14 DIAGNOSIS — M159 Polyosteoarthritis, unspecified: Secondary | ICD-10-CM | POA: Diagnosis not present

## 2014-09-14 DIAGNOSIS — G309 Alzheimer's disease, unspecified: Secondary | ICD-10-CM | POA: Diagnosis not present

## 2014-09-14 DIAGNOSIS — F0280 Dementia in other diseases classified elsewhere without behavioral disturbance: Secondary | ICD-10-CM | POA: Diagnosis not present

## 2014-09-19 DIAGNOSIS — R2689 Other abnormalities of gait and mobility: Secondary | ICD-10-CM | POA: Diagnosis not present

## 2014-09-19 DIAGNOSIS — F0280 Dementia in other diseases classified elsewhere without behavioral disturbance: Secondary | ICD-10-CM | POA: Diagnosis not present

## 2014-09-19 DIAGNOSIS — G309 Alzheimer's disease, unspecified: Secondary | ICD-10-CM | POA: Diagnosis not present

## 2014-09-19 DIAGNOSIS — I10 Essential (primary) hypertension: Secondary | ICD-10-CM | POA: Diagnosis not present

## 2014-09-19 DIAGNOSIS — M159 Polyosteoarthritis, unspecified: Secondary | ICD-10-CM | POA: Diagnosis not present

## 2014-09-20 DIAGNOSIS — R600 Localized edema: Secondary | ICD-10-CM | POA: Diagnosis not present

## 2014-09-20 DIAGNOSIS — I10 Essential (primary) hypertension: Secondary | ICD-10-CM | POA: Diagnosis not present

## 2014-09-20 DIAGNOSIS — J309 Allergic rhinitis, unspecified: Secondary | ICD-10-CM | POA: Diagnosis not present

## 2014-09-20 DIAGNOSIS — R269 Unspecified abnormalities of gait and mobility: Secondary | ICD-10-CM | POA: Diagnosis not present

## 2014-09-20 DIAGNOSIS — K5900 Constipation, unspecified: Secondary | ICD-10-CM | POA: Diagnosis not present

## 2014-09-21 DIAGNOSIS — M159 Polyosteoarthritis, unspecified: Secondary | ICD-10-CM | POA: Diagnosis not present

## 2014-09-21 DIAGNOSIS — G309 Alzheimer's disease, unspecified: Secondary | ICD-10-CM | POA: Diagnosis not present

## 2014-09-21 DIAGNOSIS — I10 Essential (primary) hypertension: Secondary | ICD-10-CM | POA: Diagnosis not present

## 2014-09-21 DIAGNOSIS — F0280 Dementia in other diseases classified elsewhere without behavioral disturbance: Secondary | ICD-10-CM | POA: Diagnosis not present

## 2014-09-21 DIAGNOSIS — R2689 Other abnormalities of gait and mobility: Secondary | ICD-10-CM | POA: Diagnosis not present

## 2014-09-22 ENCOUNTER — Encounter (HOSPITAL_COMMUNITY): Payer: Self-pay | Admitting: Emergency Medicine

## 2014-09-22 ENCOUNTER — Inpatient Hospital Stay (HOSPITAL_COMMUNITY)
Admission: EM | Admit: 2014-09-22 | Discharge: 2014-09-25 | DRG: 193 | Disposition: A | Discharge status: Hospice | Payer: Medicare Other | Attending: Internal Medicine | Admitting: Internal Medicine

## 2014-09-22 DIAGNOSIS — Z8673 Personal history of transient ischemic attack (TIA), and cerebral infarction without residual deficits: Secondary | ICD-10-CM

## 2014-09-22 DIAGNOSIS — J9601 Acute respiratory failure with hypoxia: Secondary | ICD-10-CM | POA: Diagnosis present

## 2014-09-22 DIAGNOSIS — H9190 Unspecified hearing loss, unspecified ear: Secondary | ICD-10-CM | POA: Diagnosis present

## 2014-09-22 DIAGNOSIS — J189 Pneumonia, unspecified organism: Secondary | ICD-10-CM | POA: Diagnosis not present

## 2014-09-22 DIAGNOSIS — Z515 Encounter for palliative care: Secondary | ICD-10-CM | POA: Diagnosis not present

## 2014-09-22 DIAGNOSIS — I10 Essential (primary) hypertension: Secondary | ICD-10-CM | POA: Diagnosis present

## 2014-09-22 DIAGNOSIS — Z66 Do not resuscitate: Secondary | ICD-10-CM | POA: Diagnosis present

## 2014-09-22 DIAGNOSIS — Z79899 Other long term (current) drug therapy: Secondary | ICD-10-CM | POA: Diagnosis not present

## 2014-09-22 DIAGNOSIS — R0602 Shortness of breath: Secondary | ICD-10-CM | POA: Diagnosis not present

## 2014-09-22 DIAGNOSIS — F0390 Unspecified dementia without behavioral disturbance: Secondary | ICD-10-CM | POA: Diagnosis not present

## 2014-09-22 DIAGNOSIS — Z87891 Personal history of nicotine dependence: Secondary | ICD-10-CM | POA: Diagnosis not present

## 2014-09-22 DIAGNOSIS — Y95 Nosocomial condition: Secondary | ICD-10-CM | POA: Diagnosis present

## 2014-09-22 DIAGNOSIS — I352 Nonrheumatic aortic (valve) stenosis with insufficiency: Secondary | ICD-10-CM | POA: Diagnosis not present

## 2014-09-22 DIAGNOSIS — Z79891 Long term (current) use of opiate analgesic: Secondary | ICD-10-CM | POA: Diagnosis not present

## 2014-09-22 DIAGNOSIS — R069 Unspecified abnormalities of breathing: Secondary | ICD-10-CM | POA: Diagnosis not present

## 2014-09-22 MED ORDER — DOCUSATE SODIUM 100 MG PO CAPS
100.0000 mg | ORAL_CAPSULE | Freq: Two times a day (BID) | ORAL | Status: DC
  Filled 2014-09-22 (×2): qty 1

## 2014-09-22 MED ORDER — ACETAMINOPHEN 500 MG PO TABS
500.0000 mg | ORAL_TABLET | Freq: Three times a day (TID) | ORAL | Status: DC
  Filled 2014-09-22 (×9): qty 1

## 2014-09-22 MED ORDER — ENOXAPARIN SODIUM 40 MG/0.4ML ~~LOC~~ SOLN
40.0000 mg | SUBCUTANEOUS | Status: DC
  Filled 2014-09-22 (×2): qty 0.4

## 2014-09-22 MED ORDER — SODIUM CHLORIDE 0.9 % IV SOLN
500.0000 mg | Freq: Two times a day (BID) | INTRAVENOUS | Status: DC
  Administered 2014-09-22 – 2014-09-23 (×2): 500 mg via INTRAVENOUS
  Filled 2014-09-22 (×3): qty 500

## 2014-09-22 MED ORDER — CETYLPYRIDINIUM CHLORIDE 0.05 % MT LIQD: 7.0000 mL | OROMUCOSAL | Status: DC | PRN

## 2014-09-22 MED ORDER — MORPHINE SULFATE (CONCENTRATE) 10 MG/0.5ML PO SOLN
5.0000 mg | ORAL | Status: DC | PRN
  Administered 2014-09-22 (×2): 5 mg via ORAL
  Filled 2014-09-22 (×2): qty 0.5

## 2014-09-22 MED ORDER — ONDANSETRON HCL 4 MG PO TABS: 4.0000 mg | ORAL_TABLET | Freq: Four times a day (QID) | ORAL | Status: DC | PRN

## 2014-09-22 MED ORDER — PIPERACILLIN-TAZOBACTAM 3.375 G IVPB 30 MIN
3.3750 g | Freq: Once | INTRAVENOUS | Status: AC
  Administered 2014-09-22: 3.375 g via INTRAVENOUS
  Filled 2014-09-22: qty 50

## 2014-09-22 MED ORDER — ONDANSETRON HCL 4 MG/2ML IJ SOLN: 4.0000 mg | Freq: Four times a day (QID) | INTRAMUSCULAR | Status: DC | PRN

## 2014-09-22 MED ORDER — LORAZEPAM 2 MG/ML IJ SOLN
0.2500 mg | Freq: Once | INTRAMUSCULAR | Status: AC
  Administered 2014-09-22: 0.25 mg via INTRAVENOUS
  Filled 2014-09-22: qty 1

## 2014-09-22 MED ORDER — SODIUM CHLORIDE 0.9 % IV BOLUS (SEPSIS)
500.0000 mL | Freq: Once | INTRAVENOUS | Status: AC
Start: 2014-09-22 — End: 2014-09-22
  Administered 2014-09-22: 500 mL via INTRAVENOUS

## 2014-09-22 MED ORDER — ALBUTEROL SULFATE (2.5 MG/3ML) 0.083% IN NEBU: 2.5000 mg | INHALATION_SOLUTION | Freq: Four times a day (QID) | RESPIRATORY_TRACT | Status: DC | PRN

## 2014-09-22 MED ORDER — PIPERACILLIN-TAZOBACTAM 3.375 G IVPB
3.3750 g | Freq: Three times a day (TID) | INTRAVENOUS | Status: DC
  Administered 2014-09-22 – 2014-09-23 (×2): 3.375 g via INTRAVENOUS
  Filled 2014-09-22 (×3): qty 50

## 2014-09-22 MED ORDER — MORPHINE SULFATE 25 MG/ML IV SOLN
2.0000 mg/h | INTRAVENOUS | Status: DC
  Administered 2014-09-22: 1 mg/h via INTRAVENOUS
  Filled 2014-09-22: qty 10

## 2014-09-22 MED ORDER — SODIUM CHLORIDE 0.9 % IV SOLN
INTRAVENOUS | Status: DC
Start: 2014-09-22 — End: 2014-09-23
  Administered 2014-09-22 – 2014-09-23 (×2): via INTRAVENOUS

## 2014-09-22 MED ORDER — ACETAMINOPHEN 650 MG RE SUPP: 650.0000 mg | Freq: Four times a day (QID) | RECTAL | Status: DC | PRN

## 2014-09-22 MED ORDER — MORPHINE SULFATE 2 MG/ML IJ SOLN
1.0000 mg | Freq: Once | INTRAMUSCULAR | Status: AC
  Administered 2014-09-22: 1 mg via INTRAVENOUS
  Filled 2014-09-22: qty 1

## 2014-09-22 MED ORDER — ACETAMINOPHEN 325 MG PO TABS: 650.0000 mg | ORAL_TABLET | Freq: Four times a day (QID) | ORAL | Status: DC | PRN

## 2014-09-22 MED ORDER — ALBUTEROL SULFATE (2.5 MG/3ML) 0.083% IN NEBU: 2.5000 mg | INHALATION_SOLUTION | RESPIRATORY_TRACT | Status: DC | PRN

## 2014-09-22 MED ORDER — LORAZEPAM 2 MG/ML IJ SOLN
0.5000 mg | INTRAMUSCULAR | Status: DC | PRN
  Administered 2014-09-22 – 2014-09-24 (×4): 0.5 mg via INTRAVENOUS
  Filled 2014-09-22 (×5): qty 1

## 2014-09-22 MED ORDER — LORAZEPAM 0.5 MG PO TABS: 0.5000 mg | ORAL_TABLET | Freq: Four times a day (QID) | ORAL | Status: DC | PRN

## 2014-09-22 MED ORDER — MORPHINE SULFATE 2 MG/ML IJ SOLN
2.0000 mg | Freq: Once | INTRAMUSCULAR | Status: AC
  Administered 2014-09-22: 2 mg via INTRAVENOUS
  Filled 2014-09-22: qty 1

## 2014-09-22 MED ORDER — LORAZEPAM 2 MG/ML IJ SOLN
0.5000 mg | Freq: Once | INTRAMUSCULAR | Status: AC
  Administered 2014-09-22: 0.5 mg via INTRAVENOUS
  Filled 2014-09-22: qty 1

## 2014-09-22 NOTE — ED Provider Notes (Signed)
CSN: [id number]     Arrival date & time 09/22/14  1406 History   First MD Initiated Contact with Patient 09/22/14 1417     Chief Complaint  Patient presents with  . Apnea      (Consider location/radiation/quality/duration/timing/severity/associated sxs/prior Treatment) HPI Comments: Per patient's nursing home.  Patient has had several days of worsening altered mental status and generalized weakness.  Today, he is found to be hypoxic into the 70s on room air and has been having episodes of apnea.  He had a chest x-ray that was done at facility prior to transfer, which is showing bilateral pneumonia.  He arrives with his stepson, and with patient's medical step of treatment form, which states that he does not want intubation or CPR.  Doreene Adas would like to limit medical intervention, but is okay with him having IV fluids and antibiotics.  Patient very hard of hearing, will not communicate with me, though is yelling "take me home".   Patient is a 81 y.o. male presenting with shortness of breath. The history is provided by the patient, the EMS personnel and a relative. No language interpreter was used.  Shortness of Breath Severity:  Unable to specify Onset quality:  Unable to specify Timing:  Unable to specify Progression:  Unable to specify Context: activity   Relieved by:  Nothing Worsened by:  Nothing tried Ineffective treatments:  None tried Associated symptoms: no abdominal pain, no chest pain, no cough, no fever, no headaches, no rash and no vomiting   Risk factors comment:  Recent pneumonia   Past Medical History  Diagnosis Date  . CAROTID ARTERY DISEASE 08/20/2009    doppler 2/10:  0-39% bilat ICA  / Doppler, September 21, 2010, stable, 0-39% bilateral  . Essential hypertension, benign 08/22/2008  . SINUS BRADYCARDIA 08/22/2008  . Edema 08/21/2009    Started January, 2011, probable mild volume overload and venous insufficiency  . Aortic stenosis     Mild, echo, March, 2011 / mild,  echo, April, 2012  . Aortic insufficiency     Moderate, echo, March, 2011 / Moderate, echo, April, 2012  . Atherosclerosis     aorta; duplex 1/12: stable, no dissection, mid to distal irregular plaque with ulcerative appearance similar to prior study; severe stenosis IMA  . Aortic root dilation     Mild dilatation thoracic aorta  . Hemoptysis     Significant outpatient episode February 2 011, related to pneumonia by her  . Ejection fraction     60% echo March, 2011 / EF 60-65%,, September 21, 2010, echo  . Pneumonia     Right middle lobe, February, 2011 resolved  . Pre-syncope   . Varicose veins     Patient had a procedure related to the veins in his legs, 2012  . TIA (transient ischemic attack)     Possible TIA July, 2012, /  episode of staring April 04, 2011   History reviewed. No pertinent past surgical history. No family history on file. History  Substance Use Topics  . Smoking status: Former Smoker    Quit date: 06/03/1970  . Smokeless tobacco: Not on file  . Alcohol Use: Yes     Comment: pt recieve 1 beer daily    Review of Systems  Constitutional: Negative for fever, activity change, appetite change and fatigue.  HENT: Negative for congestion, facial swelling, rhinorrhea and trouble swallowing.   Eyes: Negative for photophobia and pain.  Respiratory: Positive for apnea and shortness of breath. Negative for cough and  chest tightness.   Cardiovascular: Negative for chest pain and leg swelling.  Gastrointestinal: Negative for nausea, vomiting, abdominal pain, diarrhea and constipation.  Endocrine: Negative for polydipsia and polyuria.  Genitourinary: Negative for dysuria, urgency, decreased urine volume and difficulty urinating.  Musculoskeletal: Negative for back pain and gait problem.  Skin: Negative for color change, rash and wound.  Allergic/Immunologic: Negative for immunocompromised state.  Neurological: Negative for dizziness, facial asymmetry, speech difficulty,  weakness, numbness and headaches.  Psychiatric/Behavioral: Negative for confusion, decreased concentration and agitation.      Allergies  Review of patient's allergies indicates no known allergies.  Home Medications   Prior to Admission medications   Medication Sig Start Date End Date Taking? Authorizing Provider  acetaminophen (TYLENOL) 500 MG tablet Take 500 mg by mouth 3 (three) times daily.   Yes Historical Provider, MD  albuterol (PROVENTIL HFA;VENTOLIN HFA) 108 (90 BASE) MCG/ACT inhaler Inhale 2 puffs into the lungs every 6 (six) hours as needed for wheezing or shortness of breath.   Yes Historical Provider, MD  ALPRAZolam Prudy Feeler(XANAX) 0.25 MG tablet Take 0.25 mg by mouth daily as needed for anxiety. Takes 0.25mg  every morning, may also take 0.25mg  every 6 hours if needed   Yes Historical Provider, MD  docusate sodium (COLACE) 100 MG capsule Take 100 mg by mouth 2 (two) times daily.   Yes Historical Provider, MD  furosemide (LASIX) 20 MG tablet Take 20 mg by mouth See admin instructions. Monday, Tuesday, Wednesday and Friday.   Yes Historical Provider, MD  Melatonin 3 MG TABS Take 3 mg by mouth at bedtime.   Yes Historical Provider, MD  Skin Protectants, Misc. (BAZA PROTECT EX) Apply 1 application topically as needed (after each incontinent episode.). Apply to affected area.  Notify MD if symptoms persist longer than 1 week.   Yes Historical Provider, MD  Skin Protectants, Misc. (EUCERIN) cream Apply 1 application topically 2 (two) times daily. To dry areas.   Yes Historical Provider, MD  traMADol (ULTRAM) 50 MG tablet Take 25 mg by mouth at bedtime.   Yes Historical Provider, MD  levofloxacin (LEVAQUIN) 750 MG tablet Take 1 tablet (750 mg total) by mouth daily. First dose of Levaquin given in the emergency department on 3\4. Medication is to start on 3\5. Patient not taking: Reported on 08/26/2014 08/06/14   Arby BarretteMarcy Pfeiffer, MD   BP 155/66 mmHg  Pulse 99  Temp(Src) 98.3 F (36.8 C)  (Axillary)  Resp 10  SpO2 86% Physical Exam  Constitutional: He is oriented to person, place, and time. He appears well-developed and well-nourished. He appears distressed.  HENT:  Head: Normocephalic and atraumatic.  Mouth/Throat: No oropharyngeal exudate.  Eyes: Pupils are equal, round, and reactive to light.  Neck: Normal range of motion. Neck supple.  Cardiovascular: Normal rate, regular rhythm and normal heart sounds.  Exam reveals no gallop and no friction rub.   No murmur heard. Pulmonary/Chest: Effort normal. No respiratory distress. He has decreased breath sounds in the right lower field and the left lower field. He has no wheezes. He has no rales.  Abdominal: Soft. Bowel sounds are normal. He exhibits no distension and no mass. There is no tenderness. There is no rebound and no guarding.  Musculoskeletal: Normal range of motion. He exhibits no edema or tenderness.  Neurological: He is alert and oriented to person, place, and time. GCS eye subscore is 4. GCS verbal subscore is 4. GCS motor subscore is 6.  Moving all extremities, very hard of hearing  Skin: Skin is warm and dry.  Psychiatric: He has a normal mood and affect.    ED Course  Procedures (including critical care time) Labs Review Labs Reviewed - No data to display  Imaging Review No results found.   EKG Interpretation None      MDM   Final diagnoses:  HCAP (healthcare-associated pneumonia)    Pt is a 81 y.o. male with Pmhx as above who presents with periods of apnea, with few days of generalized weakness, dec mobility, hypoxia in 70's. Pt had mobile XR today with report reading borderline cardiomegaly bilateral, asymmetric pulmonary infiltrates, bases not imaged fully, but no obvious pleural effusions".  Patient's stepson is present at bedside, patient has to step sons, one of which is his PCP and healthcare power of attorney.  Patient has a MOST form, I have confirmed his wishes that patient not have  intubation or CPR.  He would like him to be made comfortable, would also like him to have IV fluids and antibiotics.  Does not want lab draws.  On exam, patient initially listless, and minimally responsive.  However, at time of my exam, patient is pulling off cardiac monitor and non rebreather, is yelling"take me home".  He has decreased breath sounds at bases, is hypoxic.  Patient will be given IV fluid bolus will be started on bank and Zosyn.  I will speak to [facility] for admission for comfort care with limited scope of treatment.  He'll be given small dose of morphine and Ativan for comfort.    Toy Cookey, MD 09/22/14 [phone]

## 2014-09-22 NOTE — ED Notes (Signed)
Bed: [id number] Expected date:  Expected time:  Means of arrival:  Comments: EMS - Dementia, sleep apnea, low O2 sats, DNR

## 2014-09-22 NOTE — ED Notes (Addendum)
Per EMS family request transfer related to decreased activity. Pneumonia confirmed via facility DG done in house. En route EMS reports cheynne stokes and 30 second episodes of apnea. Pt has advanced pneumonia; "may speak one word." Per family comfort care, antibiotics, and fluids.

## 2014-09-22 NOTE — H&P (Signed)
PCP:   Julieanne Manson, MD   Chief Complaint:  Shortness of breath  HPI:  81 year old male who  has a past medical history of CAROTID ARTERY DISEASE (08/20/2009); Essential hypertension, benign (08/22/2008); SINUS BRADYCARDIA (08/22/2008); Edema (08/21/2009); Aortic stenosis; Aortic insufficiency; Atherosclerosis; Aortic root dilation; Hemoptysis; Ejection fraction; Pneumonia; Pre-syncope; Varicose veins; and TIA (transient ischemic attack). Patient today was brought to the ED from skilled nursing facility after patient was found to have worsening altered mental status and generalized weakness with hypoxia and O2 sat 70s on room air. Patient at that time was having brief episodes of apnea. Chest x-ray done at the facility showed bilateral pneumonia. Patient's stepson is the healthcare power of attorney who is also physician. He discussed with the ED physician Dr. Toy Cookey and told that he would just like to get IV fluids and antibiotics and no other aggressive intervention. Patient started on vancomycin and Zosyn and IV fluids. Chest x-ray was not repeated in the ED. Patient has dementia, hearing loss. Unable to provide any significant history.  Allergies:  No Known Allergies    Past Medical History  Diagnosis Date  . CAROTID ARTERY DISEASE 08/20/2009    doppler 2/10:  0-39% bilat ICA  / Doppler, September 21, 2010, stable, 0-39% bilateral  . Essential hypertension, benign 08/22/2008  . SINUS BRADYCARDIA 08/22/2008  . Edema 08/21/2009    Started January, 2011, probable mild volume overload and venous insufficiency  . Aortic stenosis     Mild, echo, March, 2011 / mild, echo, April, 2012  . Aortic insufficiency     Moderate, echo, March, 2011 / Moderate, echo, April, 2012  . Atherosclerosis     aorta; duplex 1/12: stable, no dissection, mid to distal irregular plaque with ulcerative appearance similar to prior study; severe stenosis IMA  . Aortic root dilation     Mild dilatation thoracic  aorta  . Hemoptysis     Significant outpatient episode February 2 011, related to pneumonia by her  . Ejection fraction     60% echo March, 2011 / EF 60-65%,, September 21, 2010, echo  . Pneumonia     Right middle lobe, February, 2011 resolved  . Pre-syncope   . Varicose veins     Patient had a procedure related to the veins in his legs, 2012  . TIA (transient ischemic attack)     Possible TIA July, 2012, /  episode of staring April 04, 2011    History reviewed. No pertinent past surgical history.  Prior to Admission medications   Medication Sig Start Date End Date Taking? Authorizing Provider  acetaminophen (TYLENOL) 500 MG tablet Take 500 mg by mouth 3 (three) times daily.   Yes Historical Provider, MD  albuterol (PROVENTIL HFA;VENTOLIN HFA) 108 (90 BASE) MCG/ACT inhaler Inhale 2 puffs into the lungs every 6 (six) hours as needed for wheezing or shortness of breath.   Yes Historical Provider, MD  ALPRAZolam Prudy Feeler) 0.25 MG tablet Take 0.25 mg by mouth daily as needed for anxiety. Takes 0.25mg  every morning, may also take 0.25mg  every 6 hours if needed   Yes Historical Provider, MD  docusate sodium (COLACE) 100 MG capsule Take 100 mg by mouth 2 (two) times daily.   Yes Historical Provider, MD  furosemide (LASIX) 20 MG tablet Take 20 mg by mouth See admin instructions. Monday, Tuesday, Wednesday and Friday.   Yes Historical Provider, MD  Melatonin 3 MG TABS Take 3 mg by mouth at bedtime.   Yes Historical Provider, MD  Skin Protectants, Misc. (BAZA PROTECT EX) Apply 1 application topically as needed (after each incontinent episode.). Apply to affected area.  Notify MD if symptoms persist longer than 1 week.   Yes Historical Provider, MD  Skin Protectants, Misc. (EUCERIN) cream Apply 1 application topically 2 (two) times daily. To dry areas.   Yes Historical Provider, MD  traMADol (ULTRAM) 50 MG tablet Take 25 mg by mouth at bedtime.   Yes Historical Provider, MD  levofloxacin (LEVAQUIN) 750  MG tablet Take 1 tablet (750 mg total) by mouth daily. First dose of Levaquin given in the emergency department on 3\4. Medication is to start on 3\5. Patient not taking: Reported on 08/26/2014 08/06/14   Arby BarretteMarcy Pfeiffer, MD    Social History:  reports that he quit smoking about 44 years ago. He does not have any smokeless tobacco history on file. He reports that he drinks alcohol. He reports that he does not use illicit drugs.  Family history is noncontributory    Review of Systems:  Unable to obtain   Physical Exam: Blood pressure 153/72, pulse 91, temperature 98.3 F (36.8 C), temperature source Axillary, resp. rate 12, height 5\' 10"  (1.778 m), weight 64.411 kg (142 lb), SpO2 87 %. Constitutional:   Patient is a well-developed and well-nourished male* in no acute distress and cooperative with exam. Head: Normocephalic and atraumatic Mouth: Mucus membranes moist Neck: Supple, No Thyromegaly Cardiovascular: RRR, S1 normal, S2 normal Pulmonary/Chest: CTAB, no wheezes, rales, or rhonchi Abdominal: Soft. Non-tender, non-distended, bowel sounds are normal, no masses, organomegaly, or guarding present.  Neurological: Somnolent, arousable moving all extremities Extremities : No Cyanosis, Clubbing or Edema    Assessment/Plan Active Problems:   Pneumonia   Respiratory failure   Dementia  Acute respiratory failure Patient presented with acute respiratory failure due to pneumonia. Started on vancomycin and Zosyn. Will continue these antibiotics pharmacy to dose.  We'll start IV normal saline at 50 mL per hour. Patient blood pressure is stable with systolic blood pressure 150s We'll start sublingual morphine 5 mg every 4 hours when necessary  Dementia Stable, no be no behavioral disturbance at this time Continue Ativan when necessary  Goals of care ED physician spoke to patient's stepson who is also a physician, he is the patient's healthcare power of attorney.  Patient has a most  form which confirms the patient's wishes to be DO NOT RESUSCITATE. At this time son wants patient to get IV antibiotics and fluids no lab draws. Patient is admitted with comfort care with limited scope of treatment.  Code status: DO NOT RESUSCITATE  Family discussion: Discussed with patient's other son at bedside, who confirms that patient is DO NOT RESUSCITATE and they do not want aggressive measures except IV fluids and antibiotics.   Time Spent on Admission: 60 min  Damiah Mcdonald S [facility] Pager: [phone] 09/22/2014, 3:39 PM  If 7PM-7AM, please contact night-coverage  [email or web address]  Password [email or web address]

## 2014-09-22 NOTE — ED Notes (Signed)
Hospitalist at bedside 

## 2014-09-22 NOTE — ED Notes (Signed)
Pt agitated and taking off cardiac monitor or non rebreather. Docherty aware and reports not required at present time.

## 2014-09-22 NOTE — Progress Notes (Signed)
ANTIBIOTIC CONSULT NOTE - INITIAL  Pharmacy Consult for Vancomycin/Zosyn Indication: HCAP  No Known Allergies  Patient Measurements:  Height: 70 inches TBW: 64.4 kg IBW: 73 kg    Vital Signs: Temp: 98.3 F (36.8 C) (04/21 1423) Temp Source: Axillary (04/21 1423) BP: 155/66 mmHg (04/21 1414) Pulse Rate: 99 (04/21 1443) Intake/Output from previous day:   Intake/Output from this shift:    Labs: No results for input(s): WBC, HGB, PLT, LABCREA, CREATININE in the last 72 hours. CrCl cannot be calculated (Unknown ideal weight.). No results for input(s): VANCOTROUGH, VANCOPEAK, VANCORANDOM, GENTTROUGH, GENTPEAK, GENTRANDOM, TOBRATROUGH, TOBRAPEAK, TOBRARND, AMIKACINPEAK, AMIKACINTROU, AMIKACIN in the last 72 hours.   Microbiology: No results found for this or any previous visit (from the past 720 hour(s)).  Medical History: Past Medical History  Diagnosis Date  . CAROTID ARTERY DISEASE 08/20/2009    doppler 2/10:  0-39% bilat ICA  / Doppler, September 21, 2010, stable, 0-39% bilateral  . Essential hypertension, benign 08/22/2008  . SINUS BRADYCARDIA 08/22/2008  . Edema 08/21/2009    Started January, 2011, probable mild volume overload and venous insufficiency  . Aortic stenosis     Mild, echo, March, 2011 / mild, echo, April, 2012  . Aortic insufficiency     Moderate, echo, March, 2011 / Moderate, echo, April, 2012  . Atherosclerosis     aorta; duplex 1/12: stable, no dissection, mid to distal irregular plaque with ulcerative appearance similar to prior study; severe stenosis IMA  . Aortic root dilation     Mild dilatation thoracic aorta  . Hemoptysis     Significant outpatient episode February 2 011, related to pneumonia by her  . Ejection fraction     60% echo March, 2011 / EF 60-65%,, September 21, 2010, echo  . Pneumonia     Right middle lobe, February, 2011 resolved  . Pre-syncope   . Varicose veins     Patient had a procedure related to the veins in his legs, 2012  . TIA  (transient ischemic attack)     Possible TIA July, 2012, /  episode of staring April 04, 2011    Medications:   (Not in a hospital admission) Scheduled:   Infusions:  . piperacillin-tazobactam 3.375 g (09/22/14 1440)   PRN:  Assessment: 81 yo male nursing home resident with several days of worsening altered mental status, found hypoxic into the 70's and C-Xray from facility showing bilateral pneumonia. Vanco and Zosyn to be started for HCAP.  Goal of Therapy:  Vancomycin trough level 15-20 mcg/ml  Plan:  Will give Vancomycin 500 mg IV Q12h  Will give Zosyn 3.375 Gm IV Q8h over 4 hour infusion Will check Vanco trough level at steady state Will f/u Scr and adjust dose as needed Will f/u Urine and Blood Cx  Dorethea ClanFrens, Glendell Schlottman Ann, PharmD. 09/22/2014,3:05 PM

## 2014-09-23 ENCOUNTER — Inpatient Hospital Stay (HOSPITAL_COMMUNITY): Payer: Medicare Other

## 2014-09-23 DIAGNOSIS — I10 Essential (primary) hypertension: Secondary | ICD-10-CM

## 2014-09-23 DIAGNOSIS — J189 Pneumonia, unspecified organism: Principal | ICD-10-CM

## 2014-09-23 DIAGNOSIS — J9601 Acute respiratory failure with hypoxia: Secondary | ICD-10-CM

## 2014-09-23 LAB — BASIC METABOLIC PANEL
Anion gap: 7 (ref 5–15)
BUN: 24 mg/dL — AB (ref 6–23)
CO2: 24 mmol/L (ref 19–32)
Calcium: 8.8 mg/dL (ref 8.4–10.5)
Chloride: 111 mmol/L (ref 96–112)
Creatinine, Ser: 1.16 mg/dL (ref 0.50–1.35)
GFR calc Af Amer: 64 mL/min — ABNORMAL LOW (ref >90)
GFR calc non Af Amer: 56 mL/min — ABNORMAL LOW (ref >90)
GLUCOSE: 115 mg/dL — AB (ref 70–99)
POTASSIUM: 4.2 mmol/L (ref 3.5–5.1)
Sodium: 142 mmol/L (ref 135–145)

## 2014-09-23 LAB — CBC
HEMATOCRIT: 34.3 % — AB (ref 39.0–52.0)
HEMOGLOBIN: 11.1 g/dL — AB (ref 13.0–17.0)
MCH: 27.8 pg (ref 26.0–34.0)
MCHC: 32.4 g/dL (ref 30.0–36.0)
MCV: 86 fL (ref 78.0–100.0)
Platelets: 341 10*3/uL (ref 150–400)
RBC: 3.99 MIL/uL — AB (ref 4.22–5.81)
RDW: 15.6 % — ABNORMAL HIGH (ref 11.5–15.5)
WBC: 9.4 10*3/uL (ref 4.0–10.5)

## 2014-09-23 LAB — LACTIC ACID, PLASMA
Lactic Acid, Venous: 2.4 mmol/L (ref 0.5–2.0)
Lactic Acid, Venous: 1.2 mmol/L (ref 0.5–2.0)

## 2014-09-23 MED ORDER — DEXTROSE 5 % IV SOLN
1.0000 g | INTRAVENOUS | Status: DC
  Filled 2014-09-23: qty 1

## 2014-09-23 MED ORDER — LORAZEPAM 2 MG/ML PO CONC: 1.0000 mg | ORAL | Status: DC | PRN

## 2014-09-23 MED ORDER — ALPRAZOLAM 0.25 MG PO TABS
0.2500 mg | ORAL_TABLET | Freq: Four times a day (QID) | ORAL | Status: DC | PRN
Start: 2014-09-23 — End: 2014-09-23

## 2014-09-23 MED ORDER — DEXTROSE 5 % IV SOLN
500.0000 mg | INTRAVENOUS | Status: DC
  Filled 2014-09-23: qty 500

## 2014-09-23 MED ORDER — LORAZEPAM 2 MG/ML IJ SOLN
0.5000 mg | Freq: Once | INTRAMUSCULAR | Status: AC
  Administered 2014-09-23: 0.5 mg via INTRAVENOUS

## 2014-09-23 MED ORDER — SODIUM CHLORIDE 0.9 % IV SOLN
500.0000 mg | Freq: Two times a day (BID) | INTRAVENOUS | Status: DC
  Administered 2014-09-23: 500 mg via INTRAVENOUS
  Filled 2014-09-23: qty 500

## 2014-09-23 MED ORDER — LORAZEPAM 2 MG/ML PO CONC
0.5000 mg | Freq: Three times a day (TID) | ORAL | Status: DC
  Administered 2014-09-23 – 2014-09-24 (×3): 0.5 mg via ORAL
  Filled 2014-09-23 (×3): qty 1

## 2014-09-23 MED ORDER — DEXTROSE 5 % IV SOLN: 1.0000 g | INTRAVENOUS | Status: DC

## 2014-09-23 MED ORDER — DEXTROSE 5 % IV SOLN
1.0000 g | Freq: Two times a day (BID) | INTRAVENOUS | Status: DC
  Administered 2014-09-23: 1 g via INTRAVENOUS
  Filled 2014-09-23 (×2): qty 1

## 2014-09-23 MED ORDER — CEFTRIAXONE SODIUM IN DEXTROSE 20 MG/ML IV SOLN
1.0000 g | INTRAVENOUS | Status: DC
  Filled 2014-09-23: qty 50

## 2014-09-23 MED ORDER — GLYCOPYRROLATE 0.2 MG/ML IJ SOLN
0.1000 mg | Freq: Two times a day (BID) | INTRAMUSCULAR | Status: DC
Start: 2014-09-23 — End: 2014-09-24
  Administered 2014-09-23 – 2014-09-24 (×2): 0.1 mg via INTRAVENOUS
  Filled 2014-09-23 (×3): qty 0.5

## 2014-09-23 MED ORDER — ALPRAZOLAM 0.25 MG PO TABS: 0.2500 mg | ORAL_TABLET | Freq: Every day | ORAL | Status: DC

## 2014-09-23 MED ORDER — MORPHINE SULFATE (CONCENTRATE) 10 MG/0.5ML PO SOLN
10.0000 mg | ORAL | Status: DC | PRN
  Administered 2014-09-24: 10 mg via ORAL
  Filled 2014-09-23: qty 0.5

## 2014-09-23 MED ORDER — MORPHINE SULFATE (CONCENTRATE) 10 MG/0.5ML PO SOLN
10.0000 mg | ORAL | Status: DC
  Administered 2014-09-23 – 2014-09-24 (×3): 10 mg via ORAL
  Filled 2014-09-23 (×3): qty 0.5

## 2014-09-23 NOTE — Progress Notes (Signed)
ANTIBIOTIC CONSULT NOTE -   Pharmacy Consult for Vancomycin  Indication: HCAP  No Known Allergies  Patient Measurements: Height: 5\' 10"  (177.8 cm) Weight: 142 lb (64.411 kg) IBW/kg (Calculated) : 73  Vital Signs:   Intake/Output from previous day:   Intake/Output from this shift:    Labs:  Recent Labs  09/23/14 1127  WBC 9.4  HGB 11.1*  PLT 341  CREATININE 1.16   Estimated Creatinine Clearance: 42.4 mL/min (by C-G formula based on Cr of 1.16). No results for input(s): VANCOTROUGH, VANCOPEAK, VANCORANDOM, GENTTROUGH, GENTPEAK, GENTRANDOM, TOBRATROUGH, TOBRAPEAK, TOBRARND, AMIKACINPEAK, AMIKACINTROU, AMIKACIN in the last 72 hours.   Microbiology: No results found for this or any previous visit (from the past 720 hour(s)).  Medical History: Past Medical History  Diagnosis Date  . CAROTID ARTERY DISEASE 08/20/2009    doppler 2/10:  0-39% bilat ICA  / Doppler, September 21, 2010, stable, 0-39% bilateral  . Essential hypertension, benign 08/22/2008  . SINUS BRADYCARDIA 08/22/2008  . Edema 08/21/2009    Started January, 2011, probable mild volume overload and venous insufficiency  . Aortic stenosis     Mild, echo, March, 2011 / mild, echo, April, 2012  . Aortic insufficiency     Moderate, echo, March, 2011 / Moderate, echo, April, 2012  . Atherosclerosis     aorta; duplex 1/12: stable, no dissection, mid to distal irregular plaque with ulcerative appearance similar to prior study; severe stenosis IMA  . Aortic root dilation     Mild dilatation thoracic aorta  . Hemoptysis     Significant outpatient episode February 2 011, related to pneumonia by her  . Ejection fraction     60% echo March, 2011 / EF 60-65%,, September 21, 2010, echo  . Pneumonia     Right middle lobe, February, 2011 resolved  . Pre-syncope   . Varicose veins     Patient had a procedure related to the veins in his legs, 2012  . TIA (transient ischemic attack)     Possible TIA July, 2012, /  episode of  staring April 04, 2011   Medications:  Scheduled:  . acetaminophen  500 mg Oral TID  . ALPRAZolam  0.25 mg Oral Daily  . ceFEPime (MAXIPIME) IV  1 g Intravenous Q12H  . docusate sodium  100 mg Oral BID  . enoxaparin (LOVENOX) injection  40 mg Subcutaneous Q24H   Anti-infectives    Start     Dose/Rate Route Frequency Ordered Stop   09/23/14 1200  cefTRIAXone (ROCEPHIN) 1 g in dextrose 5 % 50 mL IVPB - Premix  Status:  Discontinued     1 g 100 mL/hr over 30 Minutes Intravenous Every 24 hours 09/23/14 1057 09/23/14 1107   09/23/14 1200  azithromycin (ZITHROMAX) 500 mg in dextrose 5 % 250 mL IVPB  Status:  Discontinued     500 mg 250 mL/hr over 60 Minutes Intravenous Every 24 hours 09/23/14 1057 09/23/14 1107   09/23/14 1200  ceFEPIme (MAXIPIME) 1 g in dextrose 5 % 50 mL IVPB  Status:  Discontinued     1 g 100 mL/hr over 30 Minutes Intravenous Every 24 hours 09/23/14 1107 09/23/14 1121   09/23/14 1200  ceFEPIme (MAXIPIME) 1 g in dextrose 5 % 50 mL IVPB     1 g 100 mL/hr over 30 Minutes Intravenous Every 12 hours 09/23/14 1121     09/23/14 1100  ceFEPIme (MAXIPIME) 1 g in dextrose 5 % 50 mL IVPB  Status:  Discontinued  1 g 100 mL/hr over 30 Minutes Intravenous Every 24 hours 09/23/14 1056 09/23/14 1057   09/22/14 2200  piperacillin-tazobactam (ZOSYN) IVPB 3.375 g  Status:  Discontinued     3.375 g 12.5 mL/hr over 240 Minutes Intravenous Every 8 hours 09/22/14 1523 09/23/14 1056   09/22/14 1600  vancomycin (VANCOCIN) 500 mg in sodium chloride 0.9 % 100 mL IVPB  Status:  Discontinued     500 mg 100 mL/hr over 60 Minutes Intravenous Every 12 hours 09/22/14 1523 09/23/14 1057   09/22/14 1430  piperacillin-tazobactam (ZOSYN) IVPB 3.375 g     3.375 g 100 mL/hr over 30 Minutes Intravenous  Once 09/22/14 1427 09/22/14 1510     Assessment: 87 yoM SNF resident admitted with HCAP. Vancomycin and Zosyn begun 4/21 pm. Vancomycin was discontinued this am 4/22 with request for no labs to be  drawn on patient and Zosyn changed to Cefepime. Vancomycin resumed this afternoon.  Goal of Therapy:  Vancomycin trough level 15-20 mcg/ml  Plan:   Resume Vancomycin  IV q12  Adjust Cefepime to 1gm q24 for reduced renal function  Minimal lab draws desired  Otho Bellows PharmD Pager [phone] 09/23/2014, 3:26 PM

## 2014-09-23 NOTE — Plan of Care (Signed)
Problem: Phase I Progression Outcomes Goal: OOB as tolerated unless otherwise ordered Outcome: Not Met (add Reason) Comfort care  Problem: Phase II Progression Outcomes Goal: Encourage coughing & deep breathing Outcome: Not Applicable Date Met:  73/31/25 Comfort care

## 2014-09-23 NOTE — Progress Notes (Signed)
UR complete 

## 2014-09-23 NOTE — Plan of Care (Signed)
Problem: Phase II Progression Outcomes Goal: Wean O2 if indicated Outcome: Not Met (add Reason) Comfort care Goal: Pain controlled Outcome: Progressing Morphine drip Goal: Tolerating diet Outcome: Not Met (add Reason) Comfort care

## 2014-09-23 NOTE — Progress Notes (Signed)
FULL COMFORT CARE. I will stop all meds not related to comfort. Nasal cannula oxygen only DO not escalate this- treat dyspnea with Roxanol PRNs. Family hoeps he can be moved to [facility] to be with his wife in a familiar environment with hospice care. I will attempt to get him on scheduled and PRN Roxanol- Hospice could also do a Pierz infusion if needed for comfort- will try SL meds for now.  Anderson MaltaElizabeth Golding, DO Palliative Medicine [phone]

## 2014-09-23 NOTE — Clinical Social Work Note (Signed)
Clinical Social Work Assessment  Patient Details  Name: Benjamin Mueller MRN: [id number] Date of Birth: 12-27-1926  Date of referral:  09/23/14               Reason for consult:  Facility Placement                Permission sought to share information with:  Chartered certified accountant granted to share information::  Yes, Verbal Permission Granted  Name::        Agency::  [facility]  Relationship::     Contact Information:     Housing/Transportation Living arrangements for the past 2 months:  [place] of Information:  Adult Children Patient Interpreter Needed:  None Criminal Activity/Legal Involvement Pertinent to Current Situation/Hospitalization:  No - Comment as needed Significant Relationships:  Adult Children, Significant Other, Community Support Lives with:  Significant Other Do you feel safe going back to the place where you live?  Yes Need for family participation in patient care:  Yes (Comment) (Pt has advanced dementia and is unable to communicate)  Care giving concerns:  None at this time; level of support is adequate   Facilities manager / plan:  CSW met with family, introduced self and role. Pt was becoming slightly agitated. CSW spoke with family regarding Pt return to [facility] upon discharge. Family reports that this is their plan and Pt lives in a room with his wife who also has dementia. Pt family expressed interest in having Hospice provide services at the assisted living facility. CSW will contact facility to see if any other needs can be addressed by CSW.   Employment status:  Retired Nurse, adult PT Recommendations:  Not assessed at this time Information / Referral to community resources:     Patient/Family's Response to care:  Pt agreeable to facilitation of return to ALF. Expressed not immediate needs at this time.   Patient/Family's Understanding of and Emotional  Response to Diagnosis, Current Treatment, and Prognosis:  Pt family were cooperative throughout assessment, pleasant and engaged. Pt family reported having accepted Pt prognosis and were working to make sure his care was high quality during Pt end stages of life. Pt family expressed being very pleased with Pt living situation and would like for Pt to return there. Pt family appreciative of CSW services.   Emotional Assessment Appearance:  Other (Comment Required (Pt unresponsive, shaking in the bed; appears to be struggling to breathe) Attitude/Demeanor/Rapport:  Unable to Assess Affect (typically observed):  Unable to Assess Orientation:   (Disoriented X4) Alcohol / Substance use:  Not Applicable Psych involvement (Current and /or in the community):  No (Comment)  Discharge Needs  Concerns to be addressed:  Denies Needs/Concerns at this time (Pt family interested in [facility]) Readmission within the last 30 days:  No Current discharge risk:  None Barriers to Discharge:  No Barriers Identified   Benjamin Mcclintock, LCSW 09/23/2014, 5:22 PM

## 2014-09-23 NOTE — Discharge Summary (Signed)
[facility] PROGRESS NOTE  Assessment/Plan: Acute respiratory failure with hypoxia of unclear source: - Patient was started on IV vancomycin and Zosyn, I will change it to vancomycin and cefepime. - Started on IV normal saline, due to her significant shortness of breath and her CODE STATUS to be in the nor deny he will start on IV morphine overnight due to his distress and had to be increased as his respiration was 30 and he was - I discussed with the son who is the HPOA and he would like at least a CBC a basic metabolic panel and a chest x-ray. - I spent over 35 minutes speaking to the son over the phone, and with the patient at bedside.  Dementia without behavioral disturbance: We'll continue Ativan when necessary. She seems to be stable.  Essential hypertension, benign: - Hold antihypertensive medication.  Goals of care: The ED physician spoke to step son was a physician and the healthcare power of attorney, he did confirm that she is a DNR/DNI she will like her to get IV fluids and antibiotics like to minimize lab draws.   Code Status: DNR/DNI Family Communication: son Benjamin Mueller  Disposition Plan: home in    Consultants:  none  Procedures:  CXR  Antibiotics:  vanc and cefepime 4.22.2016   HPI/Subjective: Nonverbal lying in bed uncomfortable and moaning.  Objective: Filed Vitals:   09/22/14 1600 09/22/14 1644 09/22/14 1658 09/22/14 2122  BP: 158/58 155/52  163/61  Pulse: 92 112  86  Temp:  97.7 F (36.5 C)  98.3 F (36.8 C)  TempSrc:  Axillary  Oral  Resp: Height:   (1.778 m)    Weight:      SpO2: 95% 80% 90% 100%    Intake/Output Summary (Last 24 hours) at 09/23/14 1055 Last data filed at 09/23/14 0938  Gross per 24 hour  Intake      0 ml  Output      0 ml  Net      0 ml   Filed Weights   09/22/14 1515  Weight: 64.411 kg (142 lb)    Exam:  General: Alert, awake, oriented x1, in no acute distress.  HEENT: No bruits, no  goiter.  Heart: Regular rate and rhythm. Lungs: Good air movement,Clear  Abdomen: Soft, nontender, nondistended, positive bowel sounds.  Neuro: Grossly intact, nonfocal.   Data Reviewed: Basic Metabolic Panel: No results for input(s): NA, K, CL, CO2, GLUCOSE, BUN, CREATININE, CALCIUM, MG, PHOS in the last 168 hours. Liver Function Tests: No results for input(s): AST, ALT, ALKPHOS, BILITOT, PROT, ALBUMIN in the last 168 hours. No results for input(s): LIPASE, AMYLASE in the last 168 hours. No results for input(s): AMMONIA in the last 168 hours. CBC: No results for input(s): WBC, NEUTROABS, HGB, HCT, MCV, PLT in the last 168 hours. Cardiac Enzymes: No results for input(s): CKTOTAL, CKMB, CKMBINDEX, TROPONINI in the last 168 hours. BNP (last 3 results) No results for input(s): BNP in the last 8760 hours.  ProBNP (last 3 results) No results for input(s): PROBNP in the last 8760 hours.  CBG: No results for input(s): GLUCAP in the last 168 hours.  No results found for this or any previous visit (from the past 240 hour(s)).   Studies: No results found.  Scheduled Meds: . acetaminophen  500 mg Oral TID  . docusate sodium  100 mg Oral BID  . enoxaparin (LOVENOX) injection  40 mg Subcutaneous Q24H  . piperacillin-tazobactam (ZOSYN)  IV  3.375 g Intravenous Q8H  . vancomycin  500 mg Intravenous Q12H   Continuous Infusions: . sodium chloride 50 mL/hr at 09/22/14 1712  . morphine 3 mg/hr (09/23/14 0847)    Time Spent: > 35 min   Marinda ElkFELIZ ORTIZ, Roby Donaway  [facility] Pager [phone]. If 7PM-7AM, please contact night-coverage at [email or web address], password [email or web address] 09/23/2014, 10:55 AM  LOS: 1 day

## 2014-09-23 NOTE — Progress Notes (Signed)
CRITICAL VALUE ALERT  Critical value received:  Lactic acid 2.4  Date of notification:  09-23-14  Time of notification:  1240  Critical value read back: yes  Nurse who received alert:  Silvano BilisKiristin Jaylene Arrowood  MD notified (1st page):  Dr. Katherine MantleFelix Ortiz  Time of first page:  1250  MD notified (2nd page):  Time of second page:  Responding MD:  DR. Robb Matarrtiz  Time MD responded:  1250

## 2014-09-24 MED ORDER — LORAZEPAM 0.5 MG PO TABS: 0.5000 mg | ORAL_TABLET | Freq: Three times a day (TID) | ORAL | Status: DC

## 2014-09-24 MED ORDER — MORPHINE BOLUS VIA INFUSION
2.0000 mg | INTRAVENOUS | Status: DC | PRN
  Administered 2014-09-24: 2 mg via INTRAVENOUS
  Filled 2014-09-24: qty 2

## 2014-09-24 MED ORDER — MORPHINE SULFATE (CONCENTRATE) 10 MG /0.5 ML PO SOLN: 20.0000 mg | ORAL | Status: DC | PRN

## 2014-09-24 MED ORDER — LORAZEPAM 2 MG/ML IJ SOLN
0.5000 mg | Freq: Four times a day (QID) | INTRAMUSCULAR | Status: DC
  Administered 2014-09-24: 0.5 mg via INTRAVENOUS
  Filled 2014-09-24 (×3): qty 1

## 2014-09-24 MED ORDER — GLYCOPYRROLATE 0.2 MG/ML IJ SOLN
0.2000 mg | Freq: Two times a day (BID) | INTRAMUSCULAR | Status: DC
  Administered 2014-09-24 – 2014-09-25 (×2): 0.2 mg via INTRAVENOUS
  Filled 2014-09-24 (×3): qty 1

## 2014-09-24 MED ORDER — LORAZEPAM 2 MG/ML IJ SOLN
1.0000 mg | INTRAMUSCULAR | Status: DC | PRN
  Administered 2014-09-25 (×2): 1 mg via INTRAVENOUS

## 2014-09-24 MED ORDER — MORPHINE BOLUS VIA INFUSION
4.0000 mg | INTRAVENOUS | Status: DC | PRN
  Administered 2014-09-25 (×2): 4 mg via INTRAVENOUS
  Filled 2014-09-24 (×3): qty 4

## 2014-09-24 NOTE — Progress Notes (Signed)
CARE MANAGEMENT NOTE 09/24/2014  Patient:  Benjamin Mueller,Benjamin Mueller   Account Number:  [id number]  Date Initiated:  09/24/2014  Documentation initiated by:  [facility],Delsin Copen  Subjective/Objective Assessment:   PNA     [facility]   Anticipated DC Date:  09/24/2014   [facility]  In-house referral  Clinical Social Worker      DC Planning Services  CM consult      Choice offered to / List presented to:             Status of service:  Completed, signed off Medicare Important Message given?  YES (If response is "NO", the following Medicare IM given date fields will be blank) Date Medicare IM given:  09/24/2014 Medicare IM given by:  [facility],Alexander Mcauley Date Additional Medicare IM given:   Additional Medicare IM given by:    Discharge Disposition:  HOSPICE MEDICAL FACILITY  Per UR Regulation:    If discussed at Long Length of Stay Meetings, dates discussed:    Comments:  09/24/2014 1530 Received call from [facility] and [facility] cannot provide care that pt will need. They do not provide suction. Notified Dr. Phillips OdorGolding and attending. Discussed with family [facility] and [facility]. Family is in agreement with [facility]. [facility] aware. Benjamin DonningAlesia Rateel Beldin RN CCM Case Mgmt phone [phone]  09/24/2014 1455 [facility] and [facility] arranging set up with [facility] for medications, DME and RN admissions for scheduled dc to facility 09/24/2014. Benjamin DonningAlesia Chelcey Caputo RN CCM Case Mgmt phone [phone]  09/24/2014 1300 NCM spoke to son and states pt was scheduled dc back to [facility] with Hospice. NCM contacted CSW and spoke to [facility], Teresa RN at facility. Per [facility], and referral was received on 09/23/2014 for dc back to facility with [facility]. They have made contact with [facility] and they are waiting on oxygen and suction. NCM contacted [facility] and spoke to [employer] RN. Requested RX for Morphine and Ativan be faxed to [facility]. Faxed  orders, facesheet, dc summary and Rx to [facility].  Benjamin DonningAlesia Kiko Ripp RN CCM Case Mgmt phone [phone]

## 2014-09-24 NOTE — Progress Notes (Signed)
CARE MANAGEMENT NOTE 09/24/2014  Patient:  Benjamin Mueller,Benjamin Mueller   Account Number:  [id number]  Date Initiated:  09/24/2014  Documentation initiated by:  Isidoro DonningSHAVIS,Kota Ciancio  Subjective/Objective Assessment:   PNA     [facility]   Anticipated DC Date:  09/24/2014   Anticipated DC Plan:  SKILLED NURSING FACILITY  In-house referral  Clinical Social Worker      DC Planning Services  CM consult      Choice offered to / List presented to:          [facility]      [facility] [place]   Status of service:  Completed, signed off Medicare Important Message given?  YES (If response is "NO", the following Medicare IM given date fields will be blank) Date Medicare IM given:  09/24/2014 Medicare IM given by:  [facility],Hillery Zachman Date Additional Medicare IM given:   Additional Medicare IM given by:    Discharge Disposition:  SKILLED NURSING FACILITY  Per UR Regulation:    If discussed at Long Length of Stay Meetings, dates discussed:    Comments:  09/24/2014 1455 [facility] and [facility] arranging set up with [facility] for medications, DME and RN admissions for scheduled dc to facility 09/24/2014. Isidoro DonningAlesia Aldine Chakraborty RN CCM Case Mgmt phone [phone]  09/24/2014 1300 NCM spoke to son and states pt was scheduled dc back to [facility] with Hospice. NCM contacted CSW and spoke to [facility], Teresa RN at facility. Per [facility], and referral was received on 09/23/2014 for dc back to facility with [facility]. They have made contact with [facility] and they are waiting on oxygen and suction. NCM contacted [facility] and spoke to [employer] RN. Requested RX for Morphine and Ativan be faxed to [facility]. Faxed orders, facesheet, dc summary and Rx to [facility].  Isidoro DonningAlesia Almeda Ezra RN CCM Case Mgmt phone [phone]

## 2014-09-24 NOTE — Progress Notes (Signed)
Patient needs hospice facility- management needs beyond what ALF can handle. [facility] referral. Stopped SL meds and put him back on IV morphine and Ativan. Glycopyrolate increased for secretions.  Anderson MaltaElizabeth Golding, DO Palliative Medicine

## 2014-09-24 NOTE — Progress Notes (Signed)
Pt calm and cooperative at this time. Following directions but still very confused. Oriented only to self. Bilateral hand restraints released, but bilateral feet restraints still in placed. Sitter at bedside. No distress noted at this time. Will continue to monitor.

## 2014-09-24 NOTE — Discharge Summary (Addendum)
Physician Discharge Summary  Benjamin Mueller:[id number] DOB: July 13, 1926 DOA: 09/22/2014  PCP: Julieanne Manson, MD  Admit date: 09/22/2014 Discharge date: 09/24/2014  Time spent: 35 minutes  Recommendations for Outpatient Follow-up:  1. Continue full comfort care continue Roxanol and Ativan.  Discharge Diagnoses:  Active Problems:   Essential hypertension, benign   CAP (community acquired pneumonia)   Acute respiratory failure with hypoxia   Dementia   Discharge Condition: Guarded  Diet recommendation: comfort  Filed Weights   09/22/14 1515  Weight: 64.411 kg (142 lb)    History of present illness:  81 year old male, presented on the day of admission with worsening altered mental status and generalized weakness with hypoxia and O2 sat 70s on room air. Patient at that time was having brief episodes of apnea. Chest x-ray done at the facility showed bilateral pneumonia. Patient's stepson is the healthcare power of attorney who is also physician. He discussed with the ED physician Dr. Toy Cookey and told that he would just like to get IV fluids and antibiotics and no other aggressive intervention. Patient started on vancomycin and Zosyn and IV fluids. Chest x-ray was not repeated in the ED. Patient has dementia, hearing loss. Unable to provide any significant history.  Hospital Course:  Acute respiratory failure with hypoxia of unclear source: - Patient was started on IV vancomycin and cefepime. - Due to his significant shortness of breath and her CODE STATUS was started on IV morphine overnight due to his distress and had to be increased as his respiration was 30 and he was - Palliative care team and family decided to move towards comfort measure on. - He will go back to [facility] with comfort measure in [facility] to follow.   Procedures:  CXR  Consultations:  PMT  Discharge Exam: Filed Vitals:   09/24/14 0627  BP: 117/56  Pulse: 113  Temp: 100.3 F  (37.9 C)  Resp: 28    General: sedated  Discharge Instructions   Discharge Instructions    Diet - low sodium heart healthy    Complete by:  As directed      Increase activity slowly    Complete by:  As directed           Current Discharge Medication List    START taking these medications   Details  LORazepam (ATIVAN) 0.5 MG tablet Take 1 tablet (0.5 mg total) by mouth every 8 (eight) hours. Qty: 30 tablet, Refills: 0    Morphine Sulfate (MORPHINE CONCENTRATE) 10 mg / 0.5 ml concentrated solution Take 1 mL (20 mg total) by mouth every 4 (four) hours as needed for severe pain. Qty: 150 mL, Refills: 0      STOP taking these medications     acetaminophen (TYLENOL) 500 MG tablet      albuterol (PROVENTIL HFA;VENTOLIN HFA) 108 (90 BASE) MCG/ACT inhaler      ALPRAZolam (XANAX) 0.25 MG tablet      docusate sodium (COLACE) 100 MG capsule      furosemide (LASIX) 20 MG tablet      Melatonin 3 MG TABS      Skin Protectants, Misc. (BAZA PROTECT EX)      Skin Protectants, Misc. (EUCERIN) cream      traMADol (ULTRAM) 50 MG tablet      levofloxacin (LEVAQUIN) 750 MG tablet        No Known Allergies    The results of significant diagnostics from this hospitalization (including imaging, microbiology, ancillary and laboratory) are  listed below for reference.    Significant Diagnostic Studies: Dg Chest Port 1 View  09/23/2014   CLINICAL DATA:  Shortness of breath.  EXAM: PORTABLE CHEST - 1 VIEW  COMPARISON:  August 05, 2014.  FINDINGS: Stable cardiomegaly. No pneumothorax is noted. Probable stable mild left pleural effusion. Significantly increased left perihilar and basilar opacities are noted, as well as right perihilar and upper lobe opacities. This is most consistent with pneumonia or possibly edema. Old right rib fractures are noted.  IMPRESSION: Significantly increased bilateral lung opacities are noted consistent with worsening pneumonia or edema. Followup radiographs  are recommended.   Electronically Signed   By: Lupita RaiderJames  Green Jr, M.D.   On: 09/23/2014 12:23    Microbiology: No results found for this or any previous visit (from the past 240 hour(s)).   Labs: Basic Metabolic Panel:  Recent Labs Lab 09/23/14 1127  NA 142  K 4.2  CL 111  CO2 24  GLUCOSE 115*  BUN 24*  CREATININE 1.16  CALCIUM 8.8   Liver Function Tests: No results for input(s): AST, ALT, ALKPHOS, BILITOT, PROT, ALBUMIN in the last 168 hours. No results for input(s): LIPASE, AMYLASE in the last 168 hours. No results for input(s): AMMONIA in the last 168 hours. CBC:  Recent Labs Lab 09/23/14 1127  WBC 9.4  HGB 11.1*  HCT 34.3*  MCV 86.0  PLT 341   Cardiac Enzymes: No results for input(s): CKTOTAL, CKMB, CKMBINDEX, TROPONINI in the last 168 hours. BNP: BNP (last 3 results) No results for input(s): BNP in the last 8760 hours.  ProBNP (last 3 results) No results for input(s): PROBNP in the last 8760 hours.  CBG: No results for input(s): GLUCAP in the last 168 hours.     Signed:  Marinda ElkFELIZ ORTIZ, Elizet Kaplan  [facility] 09/24/2014, 9:30 AM

## 2014-09-24 NOTE — Progress Notes (Signed)
Pt's family and RN requested the Pt be nasotracheal suctioned for comfort.  Pt. Nasotracheal suctioned with successful aspiration of a copious amount of thick tan secretions.

## 2014-09-24 NOTE — Clinical Social Work Note (Signed)
CSW received a call from RN stating that pt was being discharged back to [facility]  CSW reviewed handoff which stated that family was working on getting hospice into pt's facility (his home/alf)  CSW called and spoke with Clarene Critchley at [facility] who stated that she would need paperwork and review with her supervisor before pt could come back   CSW faxed discharge summary, FL2, DNR and prescriptions to [place] met with pt's son at bedside who stated that he did not want pt going back without hospice in place  CSW spoke with case manager who stated that she had  spoken to hospice and [place] and they were working to get hospice into pt ALF today  CSW received a call from [facility] who stated that after reviewing paperwork pt could not come back with suction.  CSW met with case manager who had also been told about the suction.  Case manager stated that she had contacted pt's doctors and would handle next steps related to pt discharge.  Case manager asked CSW to leave  packet in pt chart  CSW left packet in pt chart  .Dede Query, LCSW [facility] Clinical Social Worker - Weekend Coverage cell #: [phone]

## 2014-09-25 DIAGNOSIS — F0390 Unspecified dementia without behavioral disturbance: Secondary | ICD-10-CM

## 2014-09-25 DIAGNOSIS — Z515 Encounter for palliative care: Secondary | ICD-10-CM

## 2014-09-25 MED ORDER — SODIUM CHLORIDE 0.9 % IV SOLN: 2.0000 mg/h | INTRAVENOUS | Status: AC

## 2014-09-25 MED ORDER — LORAZEPAM 2 MG/ML IJ SOLN: 0.5000 mg | Freq: Four times a day (QID) | INTRAMUSCULAR | Status: AC

## 2014-09-25 MED ORDER — LORAZEPAM 2 MG/ML IJ SOLN: 1.0000 mg | INTRAMUSCULAR | Status: AC | PRN

## 2014-09-25 MED ORDER — MORPHINE BOLUS VIA INFUSION: 4.0000 mg | INTRAVENOUS | Status: AC | PRN

## 2014-09-25 NOTE — Progress Notes (Addendum)
Patient appears comfortable currently. RN reporting earlier this AM hie was struggling but responded well to morphine bolus, suctioning and repositioning. No PRNs given overnight (at least per record) but he was not symptom managed very well either. His respirations are regular- no periods of apnea.His mouth is very dry. He is not grimacing or in distress.  Filed Vitals:   09/25/14 0542  BP: 124/62  Pulse: 118  Temp: 101.9 F (38.8 C)  Resp: 24   +rhonchi, no mottling, no distress, skin and mucosa very dry, no wounds  Stable for [facility]- suspect prognosis is 24-48 hours- possibly longer-Hospice house environment will be best for this family.  Plan: 1. DNR 2. [facility]- Dr. Delanna NoticeHertwick to assume care 3. Maintain IV morphine until discharge give bolus prior to ambulance transport for comfort-otherwise management per hospice team.  Anderson MaltaElizabeth Hafsah Hendler, DO Palliative Medicine [phone]

## 2014-09-25 NOTE — Discharge Summary (Signed)
Physician Discharge Summary  Benjamin Mueller ZHY:[id number] DOB: Sep 03, 1926 DOA: 09/22/2014  PCP: Julieanne MansonGILBERT, RICHARD, MD  Admit date: 09/22/2014 Discharge date: 09/25/2014  Time spent: 35 minutes  Recommendations for Outpatient Follow-up:  1. Continue full comfort.  2. Hospice MD: Dr. Kern Reaponald Hertweck.  Discharge Diagnoses:  Active Problems:   Essential hypertension, benign   CAP (community acquired pneumonia)   Acute respiratory failure with hypoxia   Dementia   Discharge Condition: Guarded  Diet recommendation: comfort  Filed Weights   09/22/14 1515  Weight: 64.411 kg (142 lb)    History of present illness:  81 year old male, presented on the day of admission with worsening altered mental status and generalized weakness with hypoxia and O2 sat 70s on room air. Patient at that time was having brief episodes of apnea. Chest x-ray done at the facility showed bilateral pneumonia. Patient's stepson is the healthcare power of attorney who is also physician. He discussed with the ED physician Dr. Toy CookeyMegan Docherty and told that he would just like to get IV fluids and antibiotics and no other aggressive intervention. Patient started on vancomycin and Zosyn and IV fluids. Chest x-ray was not repeated in the ED. Patient has dementia, hearing loss. Unable to provide any significant history.  Hospital Course:  Acute respiratory failure with hypoxia of unclear source: - Patient was started on IV vancomycin and cefepime. - Palliative team consulted. His care was discussed with family and he was transitioned to full comfort care. - He was started on IV Morphine drip - Unresponsive this am but appears comfortable and in no distress. - He may need upper airway suctioning PRN for comfort.  Procedures:  CXR  Consultations:  PMT  Discharge Exam: Filed Vitals:   09/25/14 0542  BP: 124/62  Pulse: 118  Temp: 101.9 F (38.8 C)  Resp: 24    General: sedated/unresponsive  RS: decreased  and coarse BS's b/l. No increased WOB.  Discharge Instructions   Discharge Instructions    Call MD for:  difficulty breathing, headache or visual disturbances    Complete by:  As directed      Call MD for:  persistant nausea and vomiting    Complete by:  As directed      Call MD for:  severe uncontrolled pain    Complete by:  As directed      Call MD for:  temperature >100.4    Complete by:  As directed      Discharge instructions    Complete by:  As directed   Comfort feeds.     Increase activity slowly    Complete by:  As directed           Current Discharge Medication List    START taking these medications   Details  !! LORazepam (ATIVAN) 2 MG/ML injection Inject 0.25 mLs (0.5 mg total) into the vein every 6 (six) hours. Qty: 1 mL, Refills: 0    !! LORazepam (ATIVAN) 2 MG/ML injection Inject 0.5 mLs (1 mg total) into the vein every 4 (four) hours as needed for anxiety, seizure or sedation (agitation). Qty: 1 mL, Refills: 0    morphine 250 mg in sodium chloride 0.9 % 240 mL Inject 2 mg/hr into the vein continuous. Qty: 2 mL, Refills: 0    morphine 5 mg/mL SOLN Inject 4 mg into the vein every 30 (thirty) minutes as needed (Dyspnea, Distress, Pain). Qty: 2 mg, Refills: 0     !! - Potential duplicate medications found. Please discuss  with provider.    STOP taking these medications     acetaminophen (TYLENOL) 500 MG tablet      albuterol (PROVENTIL HFA;VENTOLIN HFA) 108 (90 BASE) MCG/ACT inhaler      ALPRAZolam (XANAX) 0.25 MG tablet      docusate sodium (COLACE) 100 MG capsule      furosemide (LASIX) 20 MG tablet      Melatonin 3 MG TABS      Skin Protectants, Misc. (BAZA PROTECT EX)      Skin Protectants, Misc. (EUCERIN) cream      traMADol (ULTRAM) 50 MG tablet      levofloxacin (LEVAQUIN) 750 MG tablet        No Known Allergies    The results of significant diagnostics from this hospitalization (including imaging, microbiology, ancillary and  laboratory) are listed below for reference.    Significant Diagnostic Studies: Dg Chest Port 1 View  09/23/2014   CLINICAL DATA:  Shortness of breath.  EXAM: PORTABLE CHEST - 1 VIEW  COMPARISON:  August 05, 2014.  FINDINGS: Stable cardiomegaly. No pneumothorax is noted. Probable stable mild left pleural effusion. Significantly increased left perihilar and basilar opacities are noted, as well as right perihilar and upper lobe opacities. This is most consistent with pneumonia or possibly edema. Old right rib fractures are noted.  IMPRESSION: Significantly increased bilateral lung opacities are noted consistent with worsening pneumonia or edema. Followup radiographs are recommended.   Electronically Signed   By: Lupita Raider, M.D.   On: 09/23/2014 12:23    Microbiology: No results found for this or any previous visit (from the past 240 hour(s)).   Labs: Basic Metabolic Panel:  Recent Labs Lab 09/23/14 1127  NA 142  K 4.2  CL 111  CO2 24  GLUCOSE 115*  BUN 24*  CREATININE 1.16  CALCIUM 8.8   Liver Function Tests: No results for input(s): AST, ALT, ALKPHOS, BILITOT, PROT, ALBUMIN in the last 168 hours. No results for input(s): LIPASE, AMYLASE in the last 168 hours. No results for input(s): AMMONIA in the last 168 hours. CBC:  Recent Labs Lab 09/23/14 1127  WBC 9.4  HGB 11.1*  HCT 34.3*  MCV 86.0  PLT 341   Cardiac Enzymes: No results for input(s): CKTOTAL, CKMB, CKMBINDEX, TROPONINI in the last 168 hours. BNP: BNP (last 3 results) No results for input(s): BNP in the last 8760 hours.  ProBNP (last 3 results) No results for input(s): PROBNP in the last 8760 hours.  CBG: No results for input(s): GLUCAP in the last 168 hours.     Signed:  [name], MD, FACP, FHM. [facility] Pager [phone]  If 7PM-7AM, please contact night-coverage [email or web address] Password [facility] 09/25/2014, 10:23 AM

## 2014-09-25 NOTE — Consult Note (Signed)
[facility] Liaison: Received request from [facility] for [facility] in [facility] 09/24/14. Spoke with patient's step-son Julieanne Mansonichard Gilbert, offered to meet with him at hospital Saturday afternoon and confirmed [facility]. He preferred to meet Sunday morning which we did. He completed registration paper work for transfer today. Appreciate help from RN, LCSW, Dr. Phillips OdorGolding and Dr. Waymon AmatoHongalgi. RN please call report to [phone]. Please fax discharge summary to [phone]. CSW aware of request for transport to be scheduled 12:30 and 1pm. Thank you. Forrestine HimEva Adyn Hoes, LCSW [phone]

## 2014-09-25 NOTE — Progress Notes (Signed)
Addendum  Patient seen and examined this AM. Discussed with RN- suctioned this am and has been comfortable since. Seen with palliative MD. Transitioned to IV Morphine gtt. [facility].   Marcellus ScottHONGALGI,Benjamin Derusha, MD, FACP, FHM. [facility] Pager [phone]  If 7PM-7AM, please contact night-coverage [email or web address] Password [email or web address] 09/25/2014, 10:26 AM

## 2014-09-25 NOTE — Progress Notes (Signed)
Called report to Nettie ElmSylvia, Charity fundraiserN, at [facility].  [phone].

## 2014-09-25 NOTE — Progress Notes (Signed)
GIP visit- Jefm MilesRoger Wassel- [facility]Erin Osborne RN  [facility]. Pt is a DNR CODE. Patient is unresponsive with morphine gtt at 2mg /hr. No family present. [facility] and staff nurse to call [facility].  Please call [facility] @ [phone] for with any hospice needs.   Thank you.  Charlotte CrumbErin Osborne,RN BSN [facility]  Hospice Liaison

## 2014-09-25 NOTE — Clinical Social Work Placement (Signed)
Clinical Social Work Department CLINICAL SOCIAL WORK PLACEMENT NOTE 09/25/2014  Patient:  Graylon GoodHOMPSON,Aswad M  Account Number:  [id number] Admit date:  09/22/2014  Clinical Social Worker:  Elray Bubaegina Haven Foss, CLINICAL SOCIAL WORKER  Date/time:  09/25/2014 01:48 PM  Clinical Social Work is seeking post-discharge placement for this patient at the following level of care: Residential Hospice     (*CSW will update this form in Epic as items are completed)     Patient/family provided with [facility] Department of Clinical Social Work's list of facilities offering this level of care within the geographic area requested by the patient (or if unable, by the patient's family).    Patient/family informed of their freedom to choose among providers that offer the needed level of care, that participate in Medicare, Medicaid or managed care program needed by the patient, have an available bed and are willing to accept the patient.    Patient/family informed of [facility]' ownership interest in [facility], as well as of the fact that they are under no obligation to receive care at this facility.  PASARR submitted to EDS on  PASARR number received on   FL2 transmitted to all facilities in geographic area requested by pt/family on   FL2 transmitted to all facilities within larger geographic area on   Patient informed that his/her managed care company has contracts with or will negotiate with  certain facilities, including the following:     Patient/family informed of bed offers received:   [facility] Physician recommends and patient chooses bed at    Patient to be transferred to  on  09/25/2014 Patient to be transferred to facility by ambulance Patient and family notified of transfer on 09/21/14 Name of family member notified: Dick/son   The following physician request were entered in Epic:   Additional Comments:  .Elray Bubaegina Arrianna Catala, LCSW [facility] Clinical Social Worker - Weekend Coverage cell #: [phone]

## 2014-09-25 NOTE — Progress Notes (Signed)
Pt leaving at this time [facility]. Writer called Johnsie Kindredstepson, Gilbert, and alerted him of transfer. [facility].

## 2014-09-25 NOTE — Progress Notes (Signed)
RN got an order for NT suctioning. RT suctioned patient and obtained thick tan secretions. Patient tolerated as well as could be expected. There is no real change in the way the patient sounds.

## 2014-10-02 DEATH — deceased

## 2016-06-24 IMAGING — DX DG CHEST 2V
2 series · 2 of 2 positions shown · non-contrast
Comparison: Earlier same day.  05/09/2014.  07/01/2013.

CLINICAL DATA: Confusion.  Seizure.

EXAM:
CHEST  2 VIEW

[chest lat]
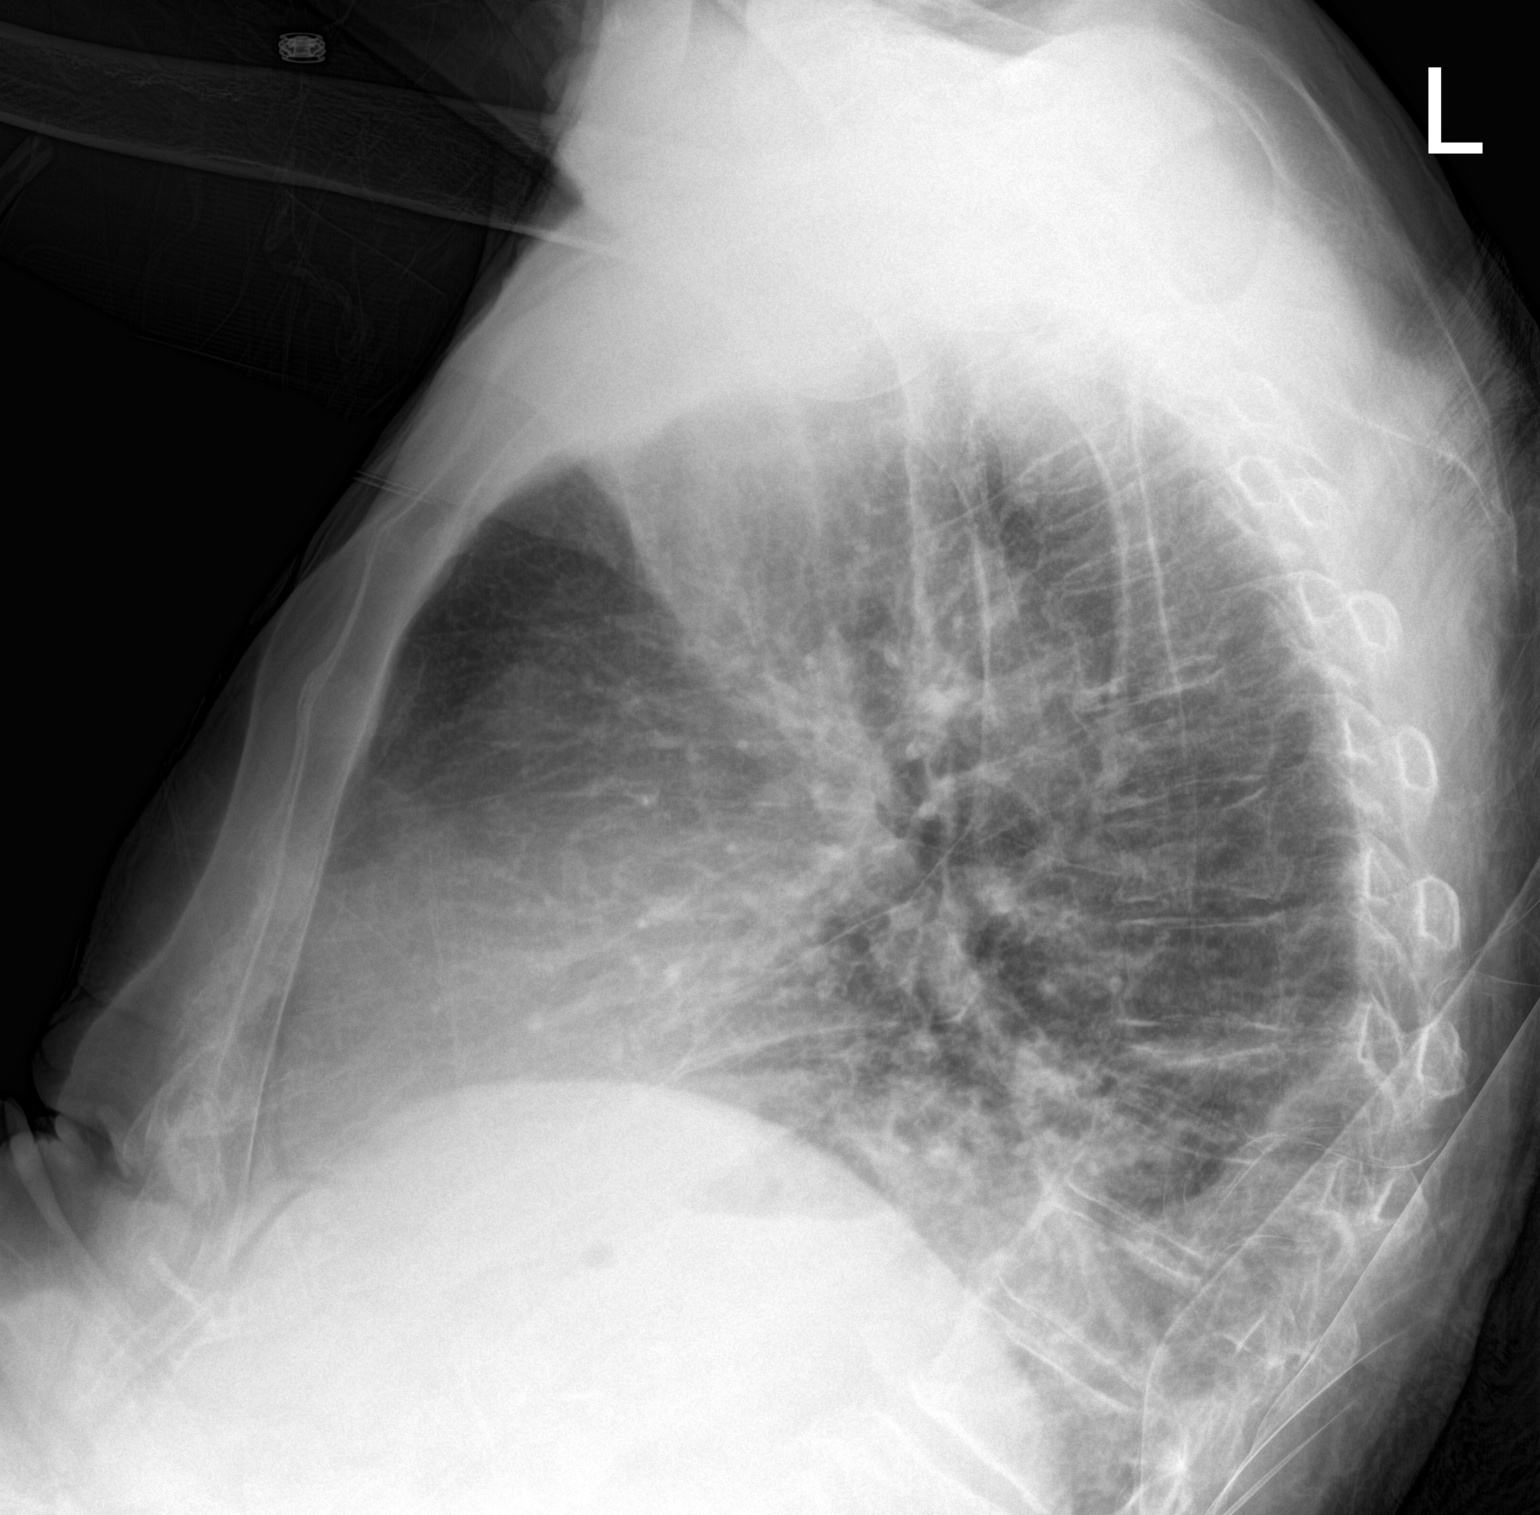

[chest ap]
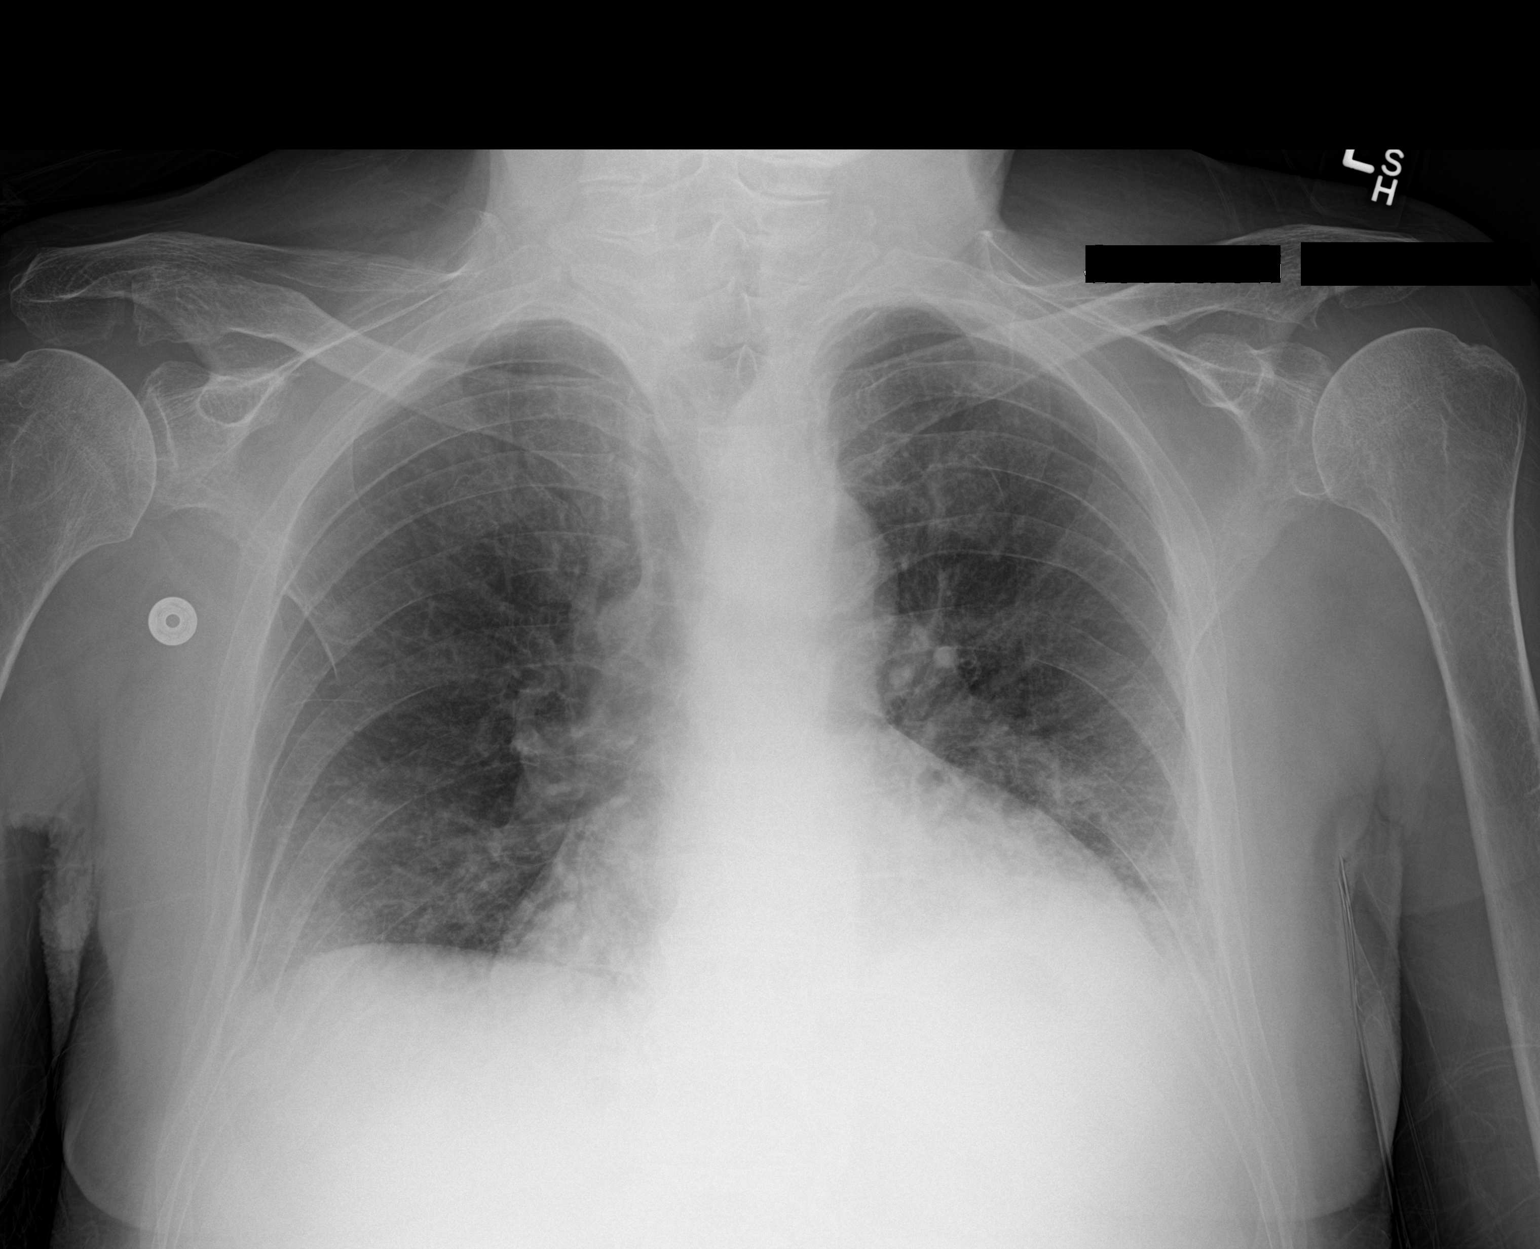

[2 of 2 positions shown; findings below may reference images not displayed]

FINDINGS: The heart is mildly enlarged. There is pneumonia in the left lower
lobe. Remainder the chest is clear. There is an old compression
fracture in the mid thoracic region an the thoracolumbar junction
region. No effusions.
IMPRESSION: This study confirms left lower lobe pneumonia.

## 2016-06-24 IMAGING — CR DG CHEST 1V PORT
1 series · 1 of 1 positions shown · non-contrast
Comparison: 05/09/2014.

CLINICAL DATA: Weakness and shaking this morning. Personal history
of pneumonia.

EXAM:
PORTABLE CHEST - 1 VIEW

[AP]
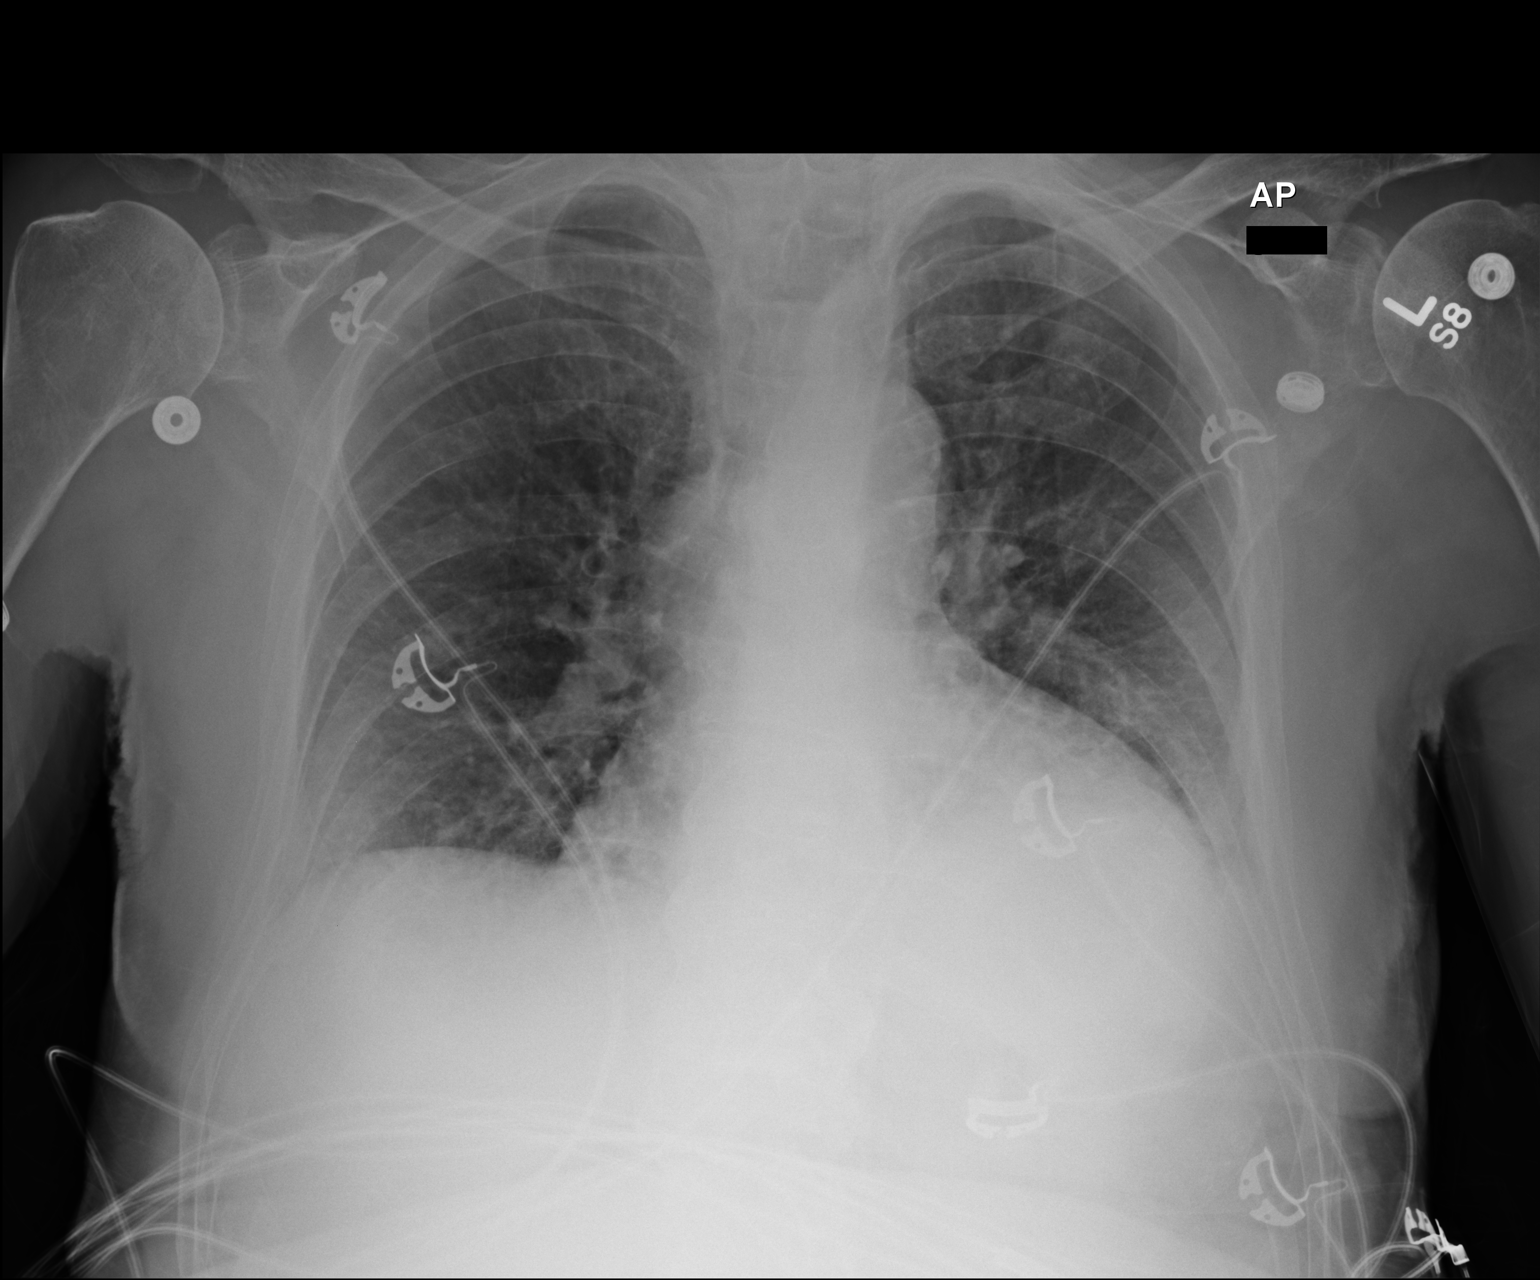

[1 of 1 positions shown; findings below may reference images not displayed]

FINDINGS: Artifact overlies the chest. Cardiac silhouette remains enlarged.
Slightly chronically prominent interstitial markings remain evident.
Abnormal retrocardiac density on the left could represent left lower
lobe pneumonia or left effusion. Consider lateral radiography when
possible.
IMPRESSION: Cardiomegaly. Abnormal retrocardiac density on the left could
represent pneumonia or effusion. Consider lateral radiography when
possible.

## 2016-08-12 IMAGING — DX DG CHEST 1V PORT
1 series · 1 of 1 positions shown · non-contrast
Comparison: August 05, 2014.

CLINICAL DATA: Shortness of breath.

EXAM:
PORTABLE CHEST - 1 VIEW

[chest ap]
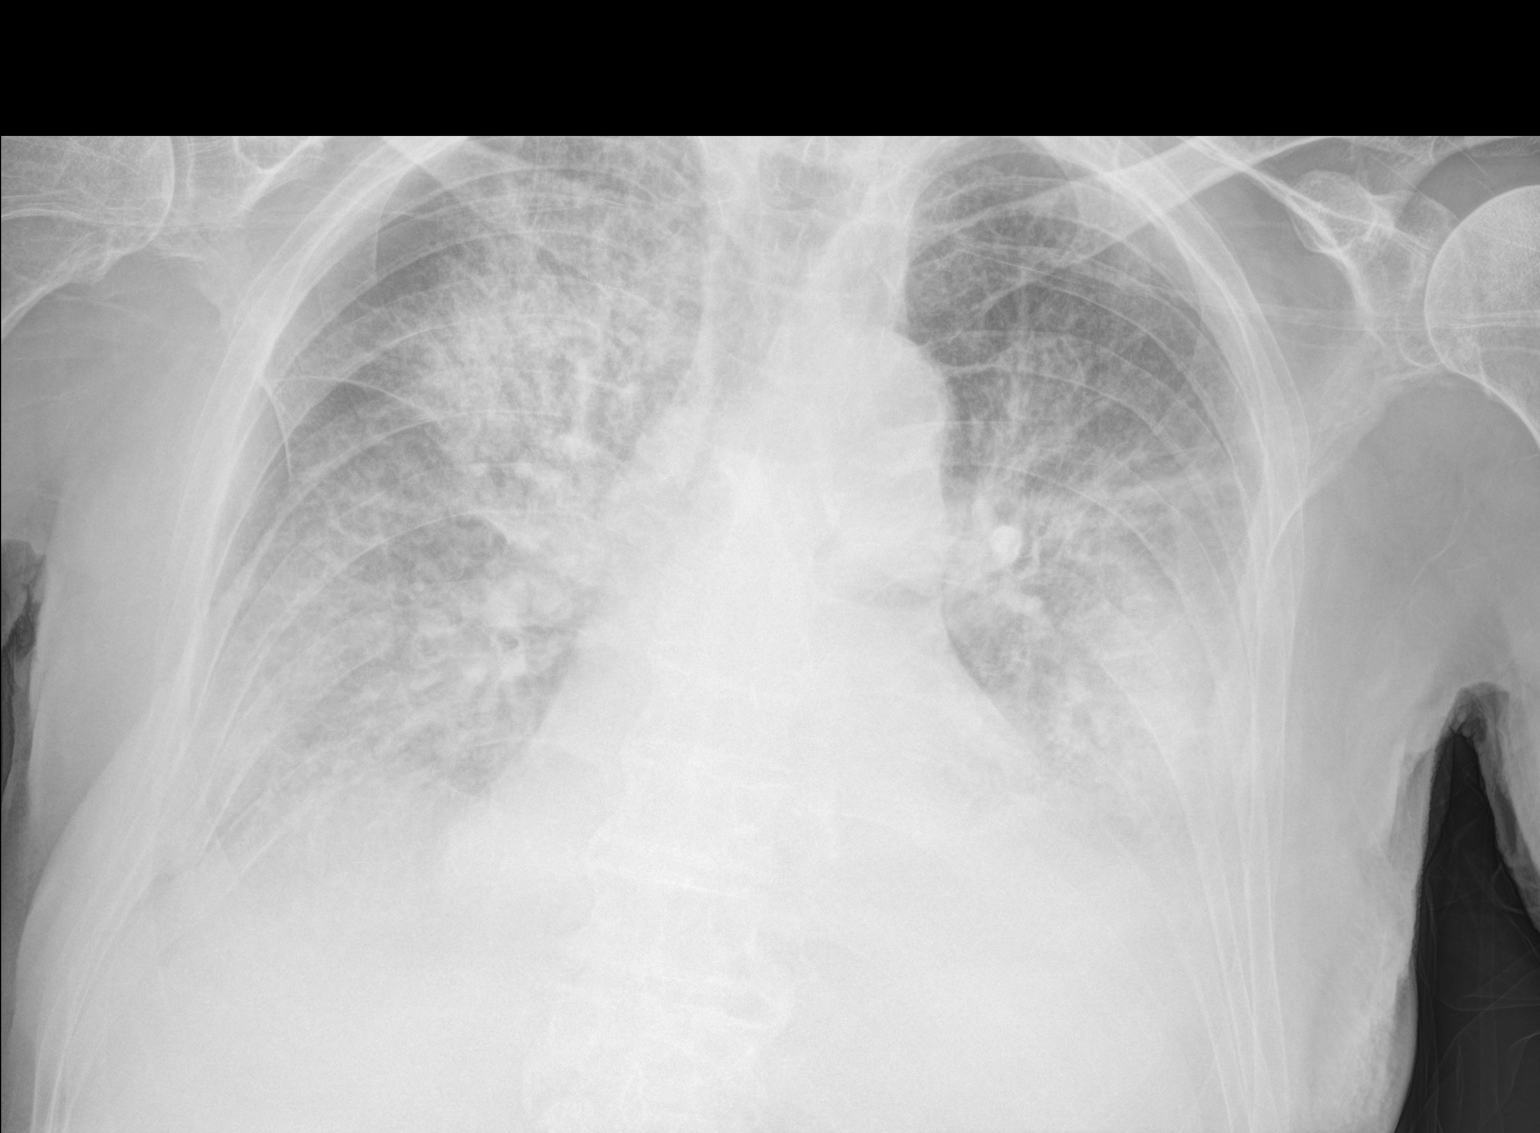

[1 of 1 positions shown; findings below may reference images not displayed]

FINDINGS: Stable cardiomegaly. No pneumothorax is noted. Probable stable mild
left pleural effusion. Significantly increased left perihilar and
basilar opacities are noted, as well as right perihilar and upper
lobe opacities. This is most consistent with pneumonia or possibly
edema. Old right rib fractures are noted.
IMPRESSION: Significantly increased bilateral lung opacities are noted
consistent with worsening pneumonia or edema. Followup radiographs
are recommended.
